# Patient Record
Sex: Male | Born: 1970 | Race: White | Hispanic: No | Marital: Married | Smoking: Never smoker
Health system: Southern US, Community
[De-identification: ages and names within clinical notes are randomized; demographics above are authoritative.]

## PROBLEM LIST (undated history)

## (undated) DIAGNOSIS — I10 Essential (primary) hypertension: Secondary | ICD-10-CM

## (undated) DIAGNOSIS — R51 Headache: Secondary | ICD-10-CM

## (undated) DIAGNOSIS — R519 Headache, unspecified: Secondary | ICD-10-CM

## (undated) DIAGNOSIS — M5136 Other intervertebral disc degeneration, lumbar region: Secondary | ICD-10-CM

## (undated) DIAGNOSIS — K469 Unspecified abdominal hernia without obstruction or gangrene: Secondary | ICD-10-CM

## (undated) DIAGNOSIS — Z972 Presence of dental prosthetic device (complete) (partial): Secondary | ICD-10-CM

## (undated) HISTORY — PX: KNEE SURGERY: SHX244

## (undated) HISTORY — PX: LUMBAR DISC SURGERY: SHX700

## (undated) HISTORY — PX: BACK SURGERY: SHX140

## (undated) HISTORY — DX: Other intervertebral disc degeneration, lumbar region: M51.36

## (undated) HISTORY — DX: Essential (primary) hypertension: I10

## (undated) HISTORY — DX: Unspecified abdominal hernia without obstruction or gangrene: K46.9

---

## 1999-04-22 ENCOUNTER — Ambulatory Visit (HOSPITAL_COMMUNITY): Admission: RE | Admit: 1999-04-22 | Discharge: 1999-04-22 | Payer: Self-pay | Admitting: Orthopedic Surgery

## 1999-04-22 ENCOUNTER — Encounter: Payer: Self-pay | Admitting: Orthopedic Surgery

## 2000-11-16 ENCOUNTER — Ambulatory Visit (HOSPITAL_COMMUNITY): Admission: RE | Admit: 2000-11-16 | Discharge: 2000-11-16 | Payer: Self-pay | Admitting: Neurosurgery

## 2000-11-16 ENCOUNTER — Encounter: Payer: Self-pay | Admitting: Neurosurgery

## 2000-12-14 ENCOUNTER — Encounter: Payer: Self-pay | Admitting: Neurosurgery

## 2000-12-14 ENCOUNTER — Encounter: Admission: RE | Admit: 2000-12-14 | Discharge: 2000-12-14 | Payer: Self-pay | Admitting: Neurosurgery

## 2001-01-15 ENCOUNTER — Encounter: Payer: Self-pay | Admitting: Neurosurgery

## 2001-01-15 ENCOUNTER — Encounter: Admission: RE | Admit: 2001-01-15 | Discharge: 2001-01-15 | Payer: Self-pay | Admitting: Neurosurgery

## 2001-04-14 ENCOUNTER — Encounter: Payer: Self-pay | Admitting: Neurosurgery

## 2001-04-16 ENCOUNTER — Inpatient Hospital Stay (HOSPITAL_COMMUNITY): Admission: RE | Admit: 2001-04-16 | Discharge: 2001-04-19 | Payer: Self-pay | Admitting: Neurosurgery

## 2001-04-16 ENCOUNTER — Encounter: Payer: Self-pay | Admitting: Neurosurgery

## 2001-04-18 ENCOUNTER — Encounter: Payer: Self-pay | Admitting: Neurosurgery

## 2002-11-02 ENCOUNTER — Encounter: Admission: RE | Admit: 2002-11-02 | Discharge: 2002-11-02 | Payer: Self-pay | Admitting: Neurosurgery

## 2002-11-02 ENCOUNTER — Encounter: Payer: Self-pay | Admitting: Neurosurgery

## 2002-11-02 ENCOUNTER — Encounter: Payer: Self-pay | Admitting: Radiology

## 2002-11-25 ENCOUNTER — Encounter: Payer: Self-pay | Admitting: Neurosurgery

## 2002-11-25 ENCOUNTER — Encounter: Admission: RE | Admit: 2002-11-25 | Discharge: 2002-11-25 | Payer: Self-pay | Admitting: Neurosurgery

## 2002-12-13 ENCOUNTER — Encounter: Admission: RE | Admit: 2002-12-13 | Discharge: 2002-12-13 | Payer: Self-pay | Admitting: Neurosurgery

## 2004-04-29 ENCOUNTER — Encounter: Admission: RE | Admit: 2004-04-29 | Discharge: 2004-04-29 | Payer: Self-pay | Admitting: Neurosurgery

## 2005-07-01 ENCOUNTER — Encounter: Admission: RE | Admit: 2005-07-01 | Discharge: 2005-07-01 | Payer: Self-pay | Admitting: Neurosurgery

## 2005-09-11 ENCOUNTER — Inpatient Hospital Stay (HOSPITAL_COMMUNITY): Admission: EM | Admit: 2005-09-11 | Discharge: 2005-09-13 | Payer: Self-pay | Admitting: Emergency Medicine

## 2007-01-30 ENCOUNTER — Emergency Department: Payer: Self-pay | Admitting: Emergency Medicine

## 2007-09-04 ENCOUNTER — Emergency Department (HOSPITAL_COMMUNITY): Admission: EM | Admit: 2007-09-04 | Discharge: 2007-09-04 | Payer: Self-pay | Admitting: Emergency Medicine

## 2007-09-07 ENCOUNTER — Encounter: Admission: RE | Admit: 2007-09-07 | Discharge: 2007-09-07 | Payer: Self-pay | Admitting: Neurosurgery

## 2007-09-14 ENCOUNTER — Encounter: Admission: RE | Admit: 2007-09-14 | Discharge: 2007-09-14 | Payer: Self-pay | Admitting: Neurosurgery

## 2007-09-27 ENCOUNTER — Encounter: Admission: RE | Admit: 2007-09-27 | Discharge: 2007-09-27 | Payer: Self-pay | Admitting: Neurosurgery

## 2007-10-22 ENCOUNTER — Ambulatory Visit (HOSPITAL_COMMUNITY): Admission: RE | Admit: 2007-10-22 | Discharge: 2007-10-24 | Payer: Self-pay | Admitting: Neurosurgery

## 2009-03-24 ENCOUNTER — Encounter: Admission: RE | Admit: 2009-03-24 | Discharge: 2009-03-24 | Payer: Self-pay | Admitting: Neurosurgery

## 2009-04-10 ENCOUNTER — Encounter: Admission: RE | Admit: 2009-04-10 | Discharge: 2009-04-10 | Payer: Self-pay | Admitting: Neurosurgery

## 2010-02-25 ENCOUNTER — Encounter: Payer: Self-pay | Admitting: Neurosurgery

## 2010-06-18 NOTE — H&P (Signed)
[name], [name]           ACCOUNT NO.:  [id number]   MEDICAL RECORD NO.:  [id number]          PATIENT TYPE:  OIB   LOCATION:  3009                         FACILITY:  [facility]   PHYSICIAN:  Payton Doughty, M.D.      DATE OF BIRTH:  [birth year]   DATE OF ADMISSION:  10/22/2007  DATE OF DISCHARGE:                              HISTORY & PHYSICAL   ADMITTING DIAGNOSES:  1. Recurrent disc at L4-5.  2. Herniated disc at L5-S1.   BODY OF TEXT:  This is a 40 year old right-handed white gentleman who  had a diskectomy done at L4-5 several years ago, did well.  He had a  fall and has had increasing pain in his back and left leg.  He has  undergone MRI that shows recurrent disc at L4-L5 and a new disc at L5-  S1.  He has failed epidural steroids and he is now admitted for  diskectomy.   MEDICAL HISTORY:  Benign.  He uses Vicodin.   SURGICAL HISTORY:  Two right knee operations as well his lumbar  diskectomy.   SOCIAL HISTORY:  Does not smoke and does not drink.  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   FAMILY HISTORY:  Father is 23 in good health.  Mother's history is not  given.   REVIEW OF SYSTEMS:  Remarkable for his back and leg pain.   PHYSICAL EXAMINATION:  HEENT:  Within normal limits.  NECK:  He has good range of motion.  CHEST:  Clear.  CARDIAC:  Regular rate and rhythm.  ABDOMEN:  Nontender.  No hepatosplenomegaly.  EXTREMITIES:  Without clubbing or cyanosis.  Peripheral pulses are good.  GU:  Deferred.  NEUROLOGIC:  He is awake, alert, and oriented.  Cranial nerves are  intact.  Motor exam shows 5/5 strength throughout the upper and lower  extremities.  Dorsiflexors on the left is about 4/5.  Sensory  dysesthesias described in the left L5-S1 distribution.  Reflexes are 2  at the knees, 1 at the ankle, toes downgoing bilaterally.  Straight leg  raise is positive on the left.   LABORATORY DATA:  MRI results has been reviewed above showed recurrent  disc at  L4-L5 and new herniated disc on the left side.   PLAN:  Laminectomy and diskectomy.  The risks and benefits have been  discussed with him, and he wishes to proceed.           ______________________________  Payton Doughty, M.D.     MWR/MEDQ  D:  10/22/2007  T:  10/23/2007  Job:  [id number]

## 2010-06-18 NOTE — Op Note (Signed)
NAMEZACKARI, RUANE           ACCOUNT NO.:  [id number]   MEDICAL RECORD NO.:  [id number]          PATIENT TYPE:  OIB   LOCATION:  3009                         FACILITY:  [facility]   PHYSICIAN:  Payton Doughty, M.D.      DATE OF BIRTH:  [birth year]   DATE OF PROCEDURE:  10/22/2007  DATE OF DISCHARGE:                               OPERATIVE REPORT   PREOPERATIVE DIAGNOSES:  Recurrent herniated disk on the left side at L4-  L5 and herniated disk on the left side at L5-S1.   POSTOPERATIVE DIAGNOSES:  Recurrent herniated disk on the left side at  L4-L5 and herniated disk on the left side at L5-S1.   OPERATIVE PROCEDURES:  Left L4-L5 and L5-S1 laminectomy and diskectomy.   SURGEON:  Payton Doughty, MD, Neurosurgery.   ANESTHESIA:  General endotracheal.   PREPARATION:  Prep and scrub with alcohol wipe.   COMPLICATIONS:  None.   NURSE ASSISTANT:  [place].   BODY OF THE TEXT:  This is a 40 year old with recurrent herniated disk  at L4-L5 and new herniated disk at L5-S1 on the left side.  He was taken  to the operating room, smoothly anesthetized and intubated, placed prone  on the operating table.  Following shave, prep, and drape in usual  sterile fashion, skin was incised from the top of L4 to the bottom of  L5, and the lamina of L4 and L5 were exposed on the left side in the  subperiosteal plane.  Intraoperative x-ray confirmed correctness of the  level.  Starting at L4-L5, the laminectomy was expanded and the scar  tissue taken down.  The epidural fat that had previously been placed was  identified, and dissection through it revealed the left L4-L5 nerve root  that was dissected free and retracted medially.  Underneath it was found  a small disk recurrence, which was removed without difficulty.  The disk  space was explored and all graspable fragments removed.  The nerve root  was carefully explored and found to be free in all quadrants.  Attention  was then turned to the L5-S1  interspace, where the hemisemilaminectomy  was carried out with the drill and Kerrison.  Ligamentum flavum removed  in retrograde fashion.  The left S1 nerve root was identified, dissected  free, and retracted medially exposing a bulging piece of annulus that  was excised resulting in immediate decompression of the nerve root.  The  disk space itself was explored and found to be free of graspable  fragments.  The nerve root was explored and found to be free in  all quadrants.  Depo-Medrol-soaked fat was placed in both laminotomy  defects.  Successive layers of 0 Vicryl, 2-0 Vicryl, and 3-0 nylon were  used to close.  Betadine and Telfa dressing was applied, made occlusive  with OpSite, and the patient returned to the recovery room in good  condition.           ______________________________  Payton Doughty, M.D.     MWR/MEDQ  D:  10/22/2007  T:  10/23/2007  Job:  [id number]

## 2010-06-21 NOTE — Discharge Summary (Signed)
[place]. [facility]  Patient:    [name], [name] Visit Number: [id number] MRN: [id number]          Service Type: SUR Location: 3000 3006 [id number] Attending Physician:  Emeterio Reeve Dictated by:   Payton Doughty, M.D. Admit Date:  04/16/2001 Discharge Date: 04/19/2001                             Discharge Summary  ADMITTING DIAGNOSIS:  Herniated disk, C6-7.  DISCHARGE DIAGNOSIS:  Herniated disk, C6-7.  PROCEDURE:  C6-7 anterior cervical diskectomy and fusion with Tether plate.  COMPLICATIONS:  None.  DISCHARGE STATUS:  Alive and well.  HISTORY OF PRESENT ILLNESS:  This is a 40 year old left-handed white gentleman whose history and physical is recounted on the chart.  He hurt his neck several years ago, had neck pain, pain out the left shoulder.  MR showed a herniated disk at 6-7, and he is admitted for fusion.  PAST MEDICAL HISTORY:  Benign.  PHYSICAL EXAMINATION:  GENERAL:  General exam is unremarkable.  NEUROLOGIC:  There is a mild left C7 radiculopathy.  HOSPITAL COURSE:  He was admitted after ascertaining normal laboratory values and underwent anterior cervical diskectomy and fusion.  Postoperatively he has done well.  He had some numbness, for which an x-ray was obtained, but that demonstrated no evidence of any difficulty with his fusion.  He is awake and alert, eating and voiding normally.  He is being discharged home in the care of his family.  FOLLOW-UP:  His follow-up will be in the [facility] office in two weeks for a lateral C-spine x-ray. Dictated by:   Payton Doughty, M.D. Attending Physician:  Emeterio Reeve DD:  04/19/01 TD:  04/20/01 Job: [id number]

## 2010-06-21 NOTE — Op Note (Signed)
[place]. [facility]  Patient:    [name], [name] Visit Number: [id number] MRN: [id number]          Service Type: SUR Location: 3000 3006 01 Attending Physician:  Emeterio Reeve Dictated by:   Payton Doughty, M.D. Proc. Date: 04/16/01 Admit Date:  04/16/2001                             Operative Report  PREOPERATIVE DIAGNOSIS:  Herniated disk C6-7.  POSTOPERATIVE DIAGNOSIS:  Herniated disk C6-7.  OPERATIVE PROCEDURE:  C6-7 anterior cervical diskectomy with tethered plate.  SURGEON:  Payton Doughty, M.D.  ANESTHESIA:  General endotracheal.  COMPLICATIONS:  None.  ASSISTANT:  Dr. Basilia Jumbo.  DESCRIPTION OF PROCEDURE:  This is a 40 year old right-handed white gentleman with a left C7 radiculopathy secondary to herniated disk C6-7.  He was taken to the operating room and placed supine on the operating table.  Following shave, he was prepped and draped in the usual sterile fashion.  The skin was incised in the midline.  The medial border of the sternocleidomastoid muscle. The platysma was identified, elevated, divided, and undermined.  The sternocleidomastoid was identified and medial dissection revealed the carotid artery tracking laterally to the left, trachea and esophagus tracking laterally to the right exposing the bones of the anterior cervical spine. Intraoperative x-rays obtained with marker in place and confirmed correctness of level.  Having confirmed correctness of level, dissection was carried out at C6-7 under gross observation.  The disk space spreader was placed and the operating microscope brought in and microdissection technique used to remove the remainder of the disk dissecting the anterior epidural space dividing the posterior longitudinal ligament and explore the neuroforamen bilaterally. Having completed diskectomy, there was a large disk found on the left side and protruding into the left C7 neuroforamen and it was removed  without difficulty.  Having completed decompression, a 7 mm bone graft was fashioned from prior allograft and tapped into place.  A 16 mm tethered plate was then placed with 13 mm screws, two in C6, and two in C7.  Intraoperative x-ray showed good placement of bone graft, plate, and screws.  The wound was irrigated and hemostasis insured.  The platysma was reapproximated with 3-0 Vicryl in interrupted fashion.  The subcutaneous tissue was reapproximated with 3-0 Vicryl interrupted fashion.  The skin was closed with 4-0 Vicryl in a running subcuticular fashion.  Benzoin and Steri-Strips were placed over an occlusive Telfa Op-Site dressing.  The patient was returned to the recovery room in good condition. Dictated by:   Payton Doughty, M.D. Attending Physician:  Emeterio Reeve DD:  04/16/01 TD:  04/17/01 Job: [id number]

## 2010-06-21 NOTE — Op Note (Signed)
NAMESACHIT, GILMAN           ACCOUNT NO.:  [id number]   MEDICAL RECORD NO.:  [id number]          PATIENT TYPE:  INP   LOCATION:  3019                         FACILITY:  [facility]   PHYSICIAN:  Payton Doughty, M.D.      DATE OF BIRTH:  Oct 02, 1970   DATE OF PROCEDURE:  09/12/2005  DATE OF DISCHARGE:                                 OPERATIVE REPORT   SURGEON:  Payton Doughty, M.D.   NURSE ASSISTANT:  [place].   SERVICE:  Neurosurgery.   PREOPERATIVE DIAGNOSIS:  Herniated disk on the left side at L4-5.   POSTOPERATIVE DIAGNOSIS:  Herniated disk on the left side at L4-5.   OPERATIVE PROCEDURE:  L4-5 laminectomy, diskectomy, done on the left.   ANESTHESIA:  General endotracheal.   PREPARATION:  Betadine scrub with an alcohol wipe.   COMPLICATIONS:  None.   INDICATIONS FOR PROCEDURE:  A 40 year old gentleman with a herniated disk on  the left side at L4-5.   DESCRIPTION OF PROCEDURE:  Taken to the operating room, anesthetized,  intubated, placed prone on the operating table.  Following shave, prep and  drape in the usual sterile fashion, the skin was infiltrated with 1%  lidocaine with 1:400,000 epinephrine, and the skin was incised over the  lamina of L4.  The lamina of L4 was exposed in a subperiosteal plane.  Intraoperative x-ray confirmed correctness of the level.  A hemi-  semilaminectomy was carried out at L4.  The top of the ligamentum flavum was  removed in a retrograde fashion.  The lateral margin of the thecal sac and  the nerve root were identified and gently dissected and retracted medially,  exposing a large herniated disk contained in the subligamentous space.  The  ligament was divided and the disk fragment removed easily.  The disk space  was explored and all graspable fragments removed.  The wound was irrigated  and hemostasis assured.  Depo-Medrol-soaked packs were used to fill the  laminectomy defect.  The incision was closed in successive layers of 2-0  Vicryl,  3-0 Vicryl and 4-0 Vicryl.  Benzoin and Steri-Strips were placed,  and the dressing completed with Telfa and OpSite.  And the patient returned  to the recovery room in good condition.           ______________________________  Payton Doughty, M.D.     MWR/MEDQ  D:  09/12/2005  T:  09/12/2005  Job:  [id number]
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
## 2010-07-16 ENCOUNTER — Encounter (INDEPENDENT_AMBULATORY_CARE_PROVIDER_SITE_OTHER): Payer: BC Managed Care – PPO | Admitting: Vascular Surgery

## 2010-07-16 DIAGNOSIS — M79609 Pain in unspecified limb: Secondary | ICD-10-CM

## 2010-07-16 NOTE — Consult Note (Signed)
NEW PATIENT CONSULTATION  KUE, FOX DOB:  09-Jun-1970                                       07/16/2010 ZOXWR#:[id number]  The patient presents today for evaluation of lower extremity discomfort. He is a 40 year old gentleman with a long history of orthopedic and low back difficulty.  He has undergone prior disk surgery 2007 and 2009. Both of these were secondary to what sounds like disk herniation with lower extremity weakness on each occasion.  He is having some recurrent back pain and is being seen by Dr. Trey Sailors for continued followup.  He does report some pain with walking and reports that he has "cold feet" at night.  PAST MEDICAL HISTORY:  Significant for borderline hypertension.  He has had prior knee surgeries in 1997 and 2000, cervical surgery in 2003.  SOCIAL HISTORY:  He is married with 2 children.  He drives a Tourist information centre manager. He does not smoke or drink alcohol.  FAMILY HISTORY:  Negative for premature atherosclerotic disease.  REVIEW OF SYSTEMS:  No weight loss or gain.  He weighs 198 pounds.  He is 5 feet 10 inches tall.  He does have pain in his legs with walking. NEUROLOGICAL:  Positive for headaches. MUSCULOSKELETAL:  Positive for arthritis, joint pain, muscle pain.  PHYSICAL EXAMINATION:  General:  A well-developed, well-nourished white male appearing stated age in no acute distress.  Vital signs:  Blood pressure is 143/102, heart rate 79, respirations 16.  HEENT:  Normal. He has 2+ radial, 2+ femoral and 2+ dorsalis pedis pulses bilaterally. He does not have any evidence of venous varicosities and no evidence of swelling.  Musculoskeletal:  Shows no major deformities or cyanosis. Neurological:  No focal weakness or paresthesias.  I did image his veins with handheld SonoSite for screening and this shows normal size saphenous vein and there is no evidence of varicosities.  I discussed the significance of all this with the patient.  I do  not see any evidence of arterial or venous pathology to account for any of his symptoms.  He will continue his followup with Dr. Channing Mutters regarding his degenerative disk disease and will see [place] on an as- needed basis.    Larina Earthly, M.D. Electronically Signed  TFE/MEDQ  D:  07/16/2010  T:  07/16/2010  Job:  [id number]  cc:   Payton Doughty, M.D.

## 2010-08-02 ENCOUNTER — Ambulatory Visit: Payer: Self-pay | Admitting: Neurosurgery

## 2010-10-22 ENCOUNTER — Ambulatory Visit: Payer: Self-pay | Admitting: Neurosurgery

## 2010-11-04 LAB — URINALYSIS, ROUTINE W REFLEX MICROSCOPIC
Hgb urine dipstick: NEGATIVE
Protein, ur: NEGATIVE
Specific Gravity, Urine: 1.035 — ABNORMAL HIGH
Urobilinogen, UA: 1

## 2010-11-04 LAB — DIFFERENTIAL
Basophils Absolute: 0
Basophils Relative: 1
Eosinophils Absolute: 0.1
Eosinophils Relative: 2
Lymphocytes Relative: 18
Lymphs Abs: 1.3
Monocytes Absolute: 0.6
Monocytes Relative: 8
Neutro Abs: 5.3
Neutrophils Relative %: 72

## 2010-11-04 LAB — COMPREHENSIVE METABOLIC PANEL
ALT: 95 — ABNORMAL HIGH
AST: 42 — ABNORMAL HIGH
Albumin: 4.3
Alkaline Phosphatase: 80
BUN: 10
CO2: 30
Calcium: 9.6
Chloride: 104
Creatinine, Ser: 0.97
GFR calc Af Amer: >60
GFR calc non Af Amer: >60
Glucose, Bld: 76
Potassium: 4.4
Sodium: 141
Total Bilirubin: 0.9
Total Protein: 7.1

## 2010-11-04 LAB — CBC
HCT: 46.5
Hemoglobin: 15.8
MCHC: 34.1
MCV: 95
Platelets: 266
RBC: 4.9
RDW: 13.2
WBC: 7.4

## 2010-11-04 LAB — URINE MICROSCOPIC-ADD ON

## 2010-11-04 LAB — PROTIME-INR
INR: 0.8
Prothrombin Time: 11.4 — ABNORMAL LOW

## 2010-11-04 LAB — APTT: aPTT: 28

## 2012-02-04 HISTORY — PX: HERNIA REPAIR: SHX51

## 2012-07-31 ENCOUNTER — Emergency Department: Payer: Self-pay | Admitting: Emergency Medicine

## 2012-08-03 DIAGNOSIS — K469 Unspecified abdominal hernia without obstruction or gangrene: Secondary | ICD-10-CM

## 2012-08-03 HISTORY — DX: Unspecified abdominal hernia without obstruction or gangrene: K46.9

## 2012-08-26 ENCOUNTER — Encounter: Payer: Self-pay | Admitting: *Deleted

## 2012-08-31 ENCOUNTER — Ambulatory Visit (INDEPENDENT_AMBULATORY_CARE_PROVIDER_SITE_OTHER): Payer: BC Managed Care – PPO | Admitting: General Surgery

## 2012-08-31 ENCOUNTER — Encounter: Payer: Self-pay | Admitting: General Surgery

## 2012-08-31 VITALS — BP 160/82 | HR 76 | Resp 14 | Ht 69.0 in | Wt 220.0 lb

## 2012-08-31 DIAGNOSIS — K429 Umbilical hernia without obstruction or gangrene: Secondary | ICD-10-CM

## 2012-08-31 NOTE — Progress Notes (Signed)
Patient ID: [name], male   DOB: 12-25-1970, 42 y.o.   MRN: [id number]  Chief Complaint  Patient presents with  . Other    evaluate for hernia    HPI [name] is a 42 y.o. male.  Patient here today for evaluation of umbilical hernia.  He first noticed this about 1 month ago. Patient reports this has gotten bigger and is painful. Patient also reports some bilateral lower leg swelling since his back surgery 2012. HPI  Past Medical History  Diagnosis Date  . Hernia July 2014    umbilical    Past Surgical History  Procedure Laterality Date  . Back surgery  2003, 2007, 2009, 2012    Dr Trey Sailors  . Knee surgery Right 1997, 2001    torn miniscus    Family History  Problem Relation Age of Onset  . Heart Problems Father     Social History History  Substance Use Topics  . Smoking status: Never Smoker   . Smokeless tobacco: Never Used  . Alcohol Use: No    No Known Allergies  Current Outpatient Prescriptions  Medication Sig Dispense Refill  . cyclobenzaprine (FLEXERIL) 10 MG tablet 2 (two) times daily as needed.       Marland Kitchen lisinopril (PRINIVIL,ZESTRIL) 20 MG tablet Take 20 mg by mouth daily.       Marland Kitchen oxyCODONE-acetaminophen (PERCOCET/ROXICET) 5-325 MG per tablet Take 1 tablet by mouth every 4 (four) hours as needed for pain.      . mometasone (ELOCON) 0.1 % cream        No current facility-administered medications for this visit.    Review of Systems Review of Systems  Constitutional: Negative.   Respiratory: Negative.   Cardiovascular: Positive for leg swelling. Negative for chest pain and palpitations.    Blood pressure 160/82, pulse 76, resp. rate 14, height 5\' 9"  (1.753 m), weight 220 lb (99.791 kg).  Physical Exam Physical Exam  Constitutional: He is oriented to person, place, and time. He appears well-developed and well-nourished.  Neck: Neck supple.  Cardiovascular: Normal rate and regular rhythm.   Pulmonary/Chest: Effort normal and  breath sounds normal.  Abdominal: Soft. Bowel sounds are normal. A hernia (umbilical area. small reducible, mildly tender) is present.  Lymphadenopathy:    He has no cervical adenopathy.  Neurological: He is alert and oriented to person, place, and time.    Data Reviewed    Assessment    Umbilical hernia. Mildly symptomatic     Plan    Discussed repair vs observation. Hernia complications, surgical repair all discussed. Await patient's decision     Patient to call the office when ready to arrange surgery.   SANKAR,SEEPLAPUTHUR G 08/31/2012, 6:52 PM

## 2012-08-31 NOTE — Patient Instructions (Addendum)
Hernia, Surgical Repair A hernia occurs when an internal organ pushes out through a weak spot in the belly (abdominal) wall muscles. Hernias commonly occur in the groin and around the navel. Hernias often can be pushed back into place (reduced). Most hernias tend to get worse over time. Problems occur when abdominal contents get stuck in the opening (incarcerated hernia). The blood supply gets cut off (strangulated hernia). This is an emergency and needs surgery. Otherwise, hernia repair can be an elective procedure. This means you can schedule this at your convenience when an emergency is not present. Because complications can occur, if you decide to repair the hernia, it is best to do it soon. When it becomes an emergency procedure, there is increased risk of complications after surgery. CAUSES   Heavy lifting.  Obesity.  Prolonged coughing.  Straining to move your bowels.  Hernias can also occur through a cut (incision) by a surgeonafter an abdominal operation. HOME CARE INSTRUCTIONS Before the repair:  Bed rest is not required. You may continue your normal activities, but avoid heavy lifting (more than 10 pounds) or straining. Cough gently. If you are a smoker, it is best to stop. Even the best hernia repair can break down with the continual strain of coughing.  Do not wear anything tight over your hernia. Do not try to keep it in with an outside bandage or truss. These can damage abdominal contents if they are trapped in the hernia sac.  Eat a normal diet. Avoid constipation. Straining over long periods of time to have a bowel movement will increase hernia size. It also can breakdown repairs. If you cannot do this with diet alone, laxatives or stool softeners may be used. PRIOR TO SURGERY, SEEK IMMEDIATE MEDICAL CARE IF: You have problems (symptoms) of a trapped (incarcerated) hernia. Symptoms include:  An oral temperature above 102 F (38.9 C) develops, or as your caregiver  suggests.  Increasing abdominal pain.  Feeling sick to your stomach(nausea) and vomiting.  You stop passing gas or stool.  The hernia is stuck outside the abdomen, looks discolored, feels hard, or is tender.  You have any changes in your bowel habits or in the hernia that is unusual for you. LET YOUR CAREGIVERS KNOW ABOUT THE FOLLOWING:  Allergies.  Medications taken including herbs, eye drops, over the counter medications, and creams.  Use of steroids (by mouth or creams).  Family or personal history of problems with anesthetics or Novocaine.  Possibility of pregnancy, if this applies.  Personal history of blood clots (thrombophlebitis).  Family or personal history of bleeding or blood problems.  Previous surgery.  Other health problems. BEFORE THE PROCEDURE You should be present 1 hour prior to your procedure, or as directed by your caregiver.  AFTER THE PROCEDURE After surgery, you will be taken to the recovery area. A nurse will watch and check your progress there. Once you are awake, stable, and taking fluids well, you will be allowed to go home as long as there are no problems. Once home, an ice pack (wrapped in a light towel) applied to your operative site may help with discomfort. It may also keep the swelling down. Do not lift anything heavier than 10 pounds (4.55 kilograms). Take showers not baths. Do not drive while taking narcotics. Follow instructions as suggested by your caregiver.  SEEK IMMEDIATE MEDICAL CARE IF: After surgery:  There is redness, swelling, or increasing pain in the wound.  There is pus coming from the wound.  There is  drainage from a wound lasting longer than 1 day.  An unexplained oral temperature above 102 F (38.9 C) develops.  You notice a foul smell coming from the wound or dressing.  There is a breaking open of a wound (edged not staying together) after the sutures have been removed.  You notice increasing pain in the shoulders  (shoulder strap areas).  You develop dizzy episodes or fainting while standing.  You develop persistent nausea or vomiting.  You develop a rash.  You have difficulty breathing.  You develop any reaction or side effects to medications given. MAKE SURE YOU:   Understand these instructions.  Will watch your condition.  Will get help right away if you are not doing well or get worse. Document Released: 07/16/2000 Document Revised: 04/14/2011 Document Reviewed: 06/08/2007 [facility] Patient Information 2014 [place], [place].  Patient to call the office when ready to arrange surgery.

## 2012-09-23 ENCOUNTER — Other Ambulatory Visit: Payer: Self-pay | Admitting: General Surgery

## 2012-09-23 ENCOUNTER — Telehealth: Payer: Self-pay | Admitting: *Deleted

## 2012-09-23 DIAGNOSIS — K429 Umbilical hernia without obstruction or gangrene: Secondary | ICD-10-CM

## 2012-09-23 NOTE — Telephone Encounter (Signed)
Patient called wanting to schedule umbilical hernia repair. This patient's surgery has been scheduled for 10-01-12 at [facility]. He reports no change in medications. Paperwork has been faxed to the patient per his request.

## 2012-09-29 ENCOUNTER — Ambulatory Visit: Payer: Self-pay | Admitting: Anesthesiology

## 2012-09-29 DIAGNOSIS — I10 Essential (primary) hypertension: Secondary | ICD-10-CM

## 2012-10-01 ENCOUNTER — Ambulatory Visit: Payer: Self-pay | Admitting: General Surgery

## 2012-10-01 DIAGNOSIS — K429 Umbilical hernia without obstruction or gangrene: Secondary | ICD-10-CM

## 2012-10-05 ENCOUNTER — Telehealth: Payer: Self-pay

## 2012-10-05 NOTE — Telephone Encounter (Signed)
Patient called to schedule Post Op hernia repair appointment. He also requested a return to work slip for light duty for today. 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 He states that he is doing very well post operatively.

## 2012-10-06 ENCOUNTER — Encounter: Payer: Self-pay | Admitting: General Surgery

## 2012-10-12 ENCOUNTER — Ambulatory Visit (INDEPENDENT_AMBULATORY_CARE_PROVIDER_SITE_OTHER): Payer: BC Managed Care – PPO | Admitting: General Surgery

## 2012-10-12 ENCOUNTER — Encounter: Payer: Self-pay | Admitting: General Surgery

## 2012-10-12 VITALS — BP 144/87 | HR 88 | Resp 12 | Ht 70.0 in | Wt 215.0 lb

## 2012-10-12 DIAGNOSIS — K429 Umbilical hernia without obstruction or gangrene: Secondary | ICD-10-CM

## 2012-10-12 NOTE — Progress Notes (Signed)
This is an 42 year old male here today for his post op umbilical hernia repair done on 10/01/12. He had a small fatty hernia with a 5mm defect in fascia. Patient states he is doing well.   No signs of infection,incision  looks clean and healing well. Abd is soft,non tender.

## 2012-10-12 NOTE — Patient Instructions (Addendum)
Patient to return as needed. 

## 2012-12-22 ENCOUNTER — Other Ambulatory Visit: Payer: Self-pay | Admitting: Neurosurgery

## 2012-12-22 DIAGNOSIS — M47816 Spondylosis without myelopathy or radiculopathy, lumbar region: Secondary | ICD-10-CM

## 2012-12-28 ENCOUNTER — Other Ambulatory Visit: Payer: Self-pay | Admitting: Neurosurgery

## 2012-12-28 ENCOUNTER — Ambulatory Visit
Admission: RE | Admit: 2012-12-28 | Discharge: 2012-12-28 | Disposition: A | Discharge status: Home / Self Care | Payer: No Typology Code available for payment source | Source: Ambulatory Visit | Attending: Neurosurgery | Admitting: Neurosurgery

## 2012-12-28 ENCOUNTER — Ambulatory Visit
Admission: RE | Admit: 2012-12-28 | Discharge: 2012-12-28 | Disposition: A | Discharge status: Home / Self Care | Payer: BC Managed Care – PPO | Source: Ambulatory Visit | Attending: Neurosurgery | Admitting: Neurosurgery

## 2012-12-28 DIAGNOSIS — M47816 Spondylosis without myelopathy or radiculopathy, lumbar region: Secondary | ICD-10-CM

## 2012-12-29 ENCOUNTER — Other Ambulatory Visit: Payer: BC Managed Care – PPO

## 2012-12-31 ENCOUNTER — Other Ambulatory Visit: Payer: BC Managed Care – PPO

## 2012-12-31 ENCOUNTER — Ambulatory Visit
Admission: RE | Admit: 2012-12-31 | Discharge: 2012-12-31 | Disposition: A | Discharge status: Home / Self Care | Payer: BC Managed Care – PPO | Source: Ambulatory Visit | Attending: Neurosurgery | Admitting: Neurosurgery

## 2012-12-31 DIAGNOSIS — M47816 Spondylosis without myelopathy or radiculopathy, lumbar region: Secondary | ICD-10-CM

## 2013-05-31 ENCOUNTER — Ambulatory Visit (INDEPENDENT_AMBULATORY_CARE_PROVIDER_SITE_OTHER): Payer: No Typology Code available for payment source | Admitting: General Surgery

## 2013-05-31 ENCOUNTER — Encounter: Payer: Self-pay | Admitting: General Surgery

## 2013-05-31 VITALS — BP 150/84 | HR 80 | Resp 12 | Ht 70.0 in | Wt 221.0 lb

## 2013-05-31 DIAGNOSIS — K429 Umbilical hernia without obstruction or gangrene: Secondary | ICD-10-CM

## 2013-05-31 DIAGNOSIS — R109 Unspecified abdominal pain: Secondary | ICD-10-CM

## 2013-05-31 NOTE — Patient Instructions (Addendum)
Follow up appointment to be announced.  Patient is scheduled for CT abdomen pelvis with contrast at [facility] location on 06/03/13 at 9:30 am. He will arrive by 9:15 am. He will have Creatinine level drawn that morning. He is to pick up a prep kit 2 days prior to his exam. He will have no solid foods 4 hours prior to the exam. patient is aware of date, time, and all instructions.

## 2013-05-31 NOTE — Progress Notes (Signed)
Patient ID: [name], male   DOB: [birth year], 43 y.o.   MRN: [id number]  Chief Complaint  Patient presents with  . Other    umbilical hernia    HPI [name] is a 43 y.o. male here today for a evaluation of a umbilical hernia. Patient states he noticed about two weeks ago. Patient states burning pain on superior of the umbilical area. Pt saw his PCP 2 weeks ago because of the pain. He reports the pain is worse with activity. He denies taking OTC pain medicine for relief.  History of umbilical hernia repair  in 2014. Repair of a subcentimeter defect at that time  HPI  Past Medical History  Diagnosis Date  . Hernia July 1275    umbilical  . Hypertension     Past Surgical History  Procedure Laterality Date  . Back surgery  2003, 2007, 2009, 2012    Dr Glenna Fellows  . Knee surgery Right 1997, 2001    torn miniscus  . Hernia repair  1700    umbilical hernia    Family History  Problem Relation Age of Onset  . Heart Problems Father     Social History History  Substance Use Topics  . Smoking status: Never Smoker   . Smokeless tobacco: Never Used  . Alcohol Use: No    Allergies  Allergen Reactions  . Latex Rash    Current Outpatient Prescriptions  Medication Sig Dispense Refill  . cyclobenzaprine (FLEXERIL) 10 MG tablet 2 (two) times daily as needed.       Marland Kitchen HYDROcodone-acetaminophen (NORCO/VICODIN) 5-325 MG per tablet       . lisinopril (PRINIVIL,ZESTRIL) 20 MG tablet Take 20 mg by mouth daily.       . mometasone (ELOCON) 0.1 % cream       . oxyCODONE-acetaminophen (PERCOCET/ROXICET) 5-325 MG per tablet Take 1 tablet by mouth every 4 (four) hours as needed for pain.       No current facility-administered medications for this visit.    Review of Systems Review of Systems  Constitutional: Negative.   Respiratory: Negative.   Cardiovascular: Negative.     Blood pressure 150/84, pulse 80, resp. rate 12, height 5' 10"  (1.778 m), weight 221 lb  (100.245 kg).  Physical Exam Physical Exam  Constitutional: He is oriented to person, place, and time. He appears well-developed and well-nourished.  Eyes: Conjunctivae are normal.  Neck: Neck supple.  Cardiovascular: Normal rate and normal heart sounds.   Pulmonary/Chest: Effort normal and breath sounds normal.  Abdominal: Soft. Normal appearance and bowel sounds are normal. There is tenderness ( mild tenderness at superioer aspect of umbilicus, 1-2 cm size ). A hernia is present.  There is tenderness to the left of the umbilicus  midabdomen.  Neurological: He is alert and oriented to person, place, and time.  Skin: Skin is warm and dry.    Data Reviewed None   Assessment    Small umbilical hernia does not fully acount for left midabdomen pain.     Plan    Patient to have a cat scan. Discussed umbilical hernia repair.     Patient is scheduled for CT abdomen pelvis with contrast at [facility] location on 06/03/13 at 9:30 am. He will arrive by 9:15 am. He will have Creatinine level drawn that morning. He is to pick up a prep kit 2 days prior to his exam. He will have no solid foods 4 hours prior to the exam. patient is  aware of date, time, and all instructions.  Jahyra Sukup G [place] 06/01/2013, 6:04 AM

## 2013-06-01 ENCOUNTER — Encounter: Payer: Self-pay | Admitting: General Surgery

## 2013-06-03 ENCOUNTER — Ambulatory Visit: Payer: Self-pay | Admitting: General Surgery

## 2013-06-03 ENCOUNTER — Encounter: Payer: Self-pay | Admitting: General Surgery

## 2013-06-06 ENCOUNTER — Ambulatory Visit: Payer: BC Managed Care – PPO | Admitting: General Surgery

## 2013-06-06 ENCOUNTER — Telehealth: Payer: Self-pay

## 2013-06-06 NOTE — Telephone Encounter (Signed)
Patient has been scheduled for repair [facility] umbilical her[facility] on 06/21/13. He will pre admit by phone on 06/13/13. He is aware of date and instructions. Surgery instructions have been mailed to him.

## 2013-06-08 ENCOUNTER — Other Ambulatory Visit: Payer: Self-pay | Admitting: General Surgery

## 2013-06-08 DIAGNOSIS — K429 Umbilical hernia without obstruction or gangrene: Secondary | ICD-10-CM

## 2013-06-21 ENCOUNTER — Ambulatory Visit: Payer: Self-pay | Admitting: General Surgery

## 2013-06-21 DIAGNOSIS — K429 Umbilical hernia without obstruction or gangrene: Secondary | ICD-10-CM

## 2013-06-22 ENCOUNTER — Encounter: Payer: Self-pay | Admitting: General Surgery

## 2013-06-23 ENCOUNTER — Telehealth: Payer: Self-pay | Admitting: *Deleted

## 2013-06-23 NOTE — Telephone Encounter (Signed)
Pt had umbilical hernia on 06/14/13 and was just wondering when he was able to return to work

## 2013-06-30 ENCOUNTER — Ambulatory Visit: Payer: No Typology Code available for payment source | Admitting: General Surgery

## 2013-07-04 ENCOUNTER — Encounter: Payer: Self-pay | Admitting: General Surgery

## 2013-07-04 ENCOUNTER — Ambulatory Visit (INDEPENDENT_AMBULATORY_CARE_PROVIDER_SITE_OTHER): Payer: No Typology Code available for payment source | Admitting: General Surgery

## 2013-07-04 VITALS — BP 152/84 | HR 74 | Resp 12 | Ht 70.0 in | Wt 218.0 lb

## 2013-07-04 DIAGNOSIS — K429 Umbilical hernia without obstruction or gangrene: Secondary | ICD-10-CM

## 2013-07-04 NOTE — Patient Instructions (Signed)
Patient to return in 1 month for follow up. The patient is aware to call back for any questions or concerns.  

## 2013-07-04 NOTE — Progress Notes (Signed)
Patient ID: [name], male   DOB: 12/22/1970, 43 y.o.   MRN: [id number]  The patient presents for a post op umbilical hernia repair. Procedure done laparoscopic, and 8cm Ventralex patch used. The procedure was performed on 06/21/13. No complaints at this time.   Hernia repair intact. Clean healing port sites. Abdomen is soft, non tender.  May advance activity slowly as tolerated. F/U 1 mo.

## 2013-08-03 ENCOUNTER — Ambulatory Visit (INDEPENDENT_AMBULATORY_CARE_PROVIDER_SITE_OTHER): Payer: No Typology Code available for payment source | Admitting: General Surgery

## 2013-08-03 ENCOUNTER — Encounter: Payer: Self-pay | Admitting: General Surgery

## 2013-08-03 VITALS — BP 146/80 | HR 76 | Resp 14 | Ht 70.0 in | Wt 218.0 lb

## 2013-08-03 DIAGNOSIS — K420 Umbilical hernia with obstruction, without gangrene: Secondary | ICD-10-CM

## 2013-08-03 NOTE — Patient Instructions (Signed)
Patient to return as needed. 

## 2013-08-03 NOTE — Progress Notes (Signed)
The patient presents for a post op umbilical hernia repair. Procedure done laparoscopic, and 8cm Ventralex patch used. The procedure was performed on 06/21/13. Redness noticed about three days ago  5 mm red area at end of umbilical incision with small residual vicryl stitch removed.   Hernia repair intact. Abdomen is soft, non tender.  Advised to use antibiotic ointment for few days F/u prn.

## 2013-08-05 ENCOUNTER — Encounter: Payer: Self-pay | Admitting: General Surgery

## 2013-11-08 ENCOUNTER — Other Ambulatory Visit: Payer: Self-pay | Admitting: Neurosurgery

## 2013-11-08 DIAGNOSIS — M4302 Spondylolysis, cervical region: Secondary | ICD-10-CM

## 2013-11-15 ENCOUNTER — Ambulatory Visit
Admission: RE | Admit: 2013-11-15 | Discharge: 2013-11-15 | Disposition: A | Discharge status: Home / Self Care | Payer: No Typology Code available for payment source | Source: Ambulatory Visit | Attending: Neurosurgery | Admitting: Neurosurgery

## 2013-11-15 DIAGNOSIS — M4302 Spondylolysis, cervical region: Secondary | ICD-10-CM

## 2014-03-29 ENCOUNTER — Observation Stay: Payer: Self-pay | Admitting: Internal Medicine

## 2014-04-21 ENCOUNTER — Other Ambulatory Visit: Payer: Self-pay | Admitting: Neurosurgery

## 2014-04-21 DIAGNOSIS — M5020 Other cervical disc displacement, unspecified cervical region: Secondary | ICD-10-CM

## 2014-04-27 ENCOUNTER — Ambulatory Visit
Admission: RE | Admit: 2014-04-27 | Discharge: 2014-04-27 | Disposition: A | Discharge status: Home / Self Care | Payer: No Typology Code available for payment source | Source: Ambulatory Visit | Attending: Neurosurgery | Admitting: Neurosurgery

## 2014-04-27 DIAGNOSIS — M5020 Other cervical disc displacement, unspecified cervical region: Secondary | ICD-10-CM

## 2014-04-27 MED ORDER — IOHEXOL 300 MG/ML  SOLN
1.0000 mL | Freq: Once | INTRAMUSCULAR | Status: AC | PRN
  Administered 2014-04-27: 1 mL via EPIDURAL

## 2014-04-27 MED ORDER — TRIAMCINOLONE ACETONIDE 40 MG/ML IJ SUSP (RADIOLOGY)
60.0000 mg | Freq: Once | INTRAMUSCULAR | Status: AC
  Administered 2014-04-27: 60 mg via EPIDURAL

## 2014-04-27 NOTE — Discharge Instructions (Signed)

## 2014-05-26 NOTE — Op Note (Signed)
PATIENT NAME:  [name], [name] MR#:  [id number] DATE OF BIRTH:  10-02-1970  DATE OF PROCEDURE:  10/01/2012  PREOPERATIVE DIAGNOSIS: Umbilical hernia.  POSTOPERATIVE DIAGNOSIS: Umbilical hernia.  OPERATION: Repair of umbilical hernia.   SURGEON: Kathreen CosierS. G. Miriana Gaertner, M.D.   ANESTHESIA: General.   COMPLICATIONS: None.   ESTIMATED BLOOD LOSS: Minimal.   DRAINS: None.   DESCRIPTION OF PROCEDURE: The patient was put to sleep with a LMA. Her abdomen was prepped and draped out as a sterile field. Timeout was performed and the umbilical area was palpated. There was a tiny defect that was identified with a 1 to 1.5 cm protrusion from the umbilicus. A transverse incision was made overlying the inferior lip  of the umbilicus. The skin was elevated to reveal a tiny hernia pushing through a defect of about 5 mm. This was freed and excised out with cautery. The fascial opening was then closed with a single figure-of-eight stitch of 0 Prolene. The subcutaneous tissue was approximated with 3-0 Vicryl, and the skin closed with subcuticular 4-0 Vicryl, covered with Dermabond. The procedure was well tolerated. He was subsequently returned to the recovery room in stable condition.  ____________________________ S.Wynona LunaG. Alyaan Budzynski, MD sgs:sb D: 10/01/2012 16:25:55 ET T: 10/01/2012 16:47:24 ET JOB#: [id number]  cc: Timoteo ExposeS.G. Evette CristalSankar, MD, <Dictator> [facility] Wynona LunaG Dontrey Snellgrove MD ELECTRONICALLY SIGNED 10/03/2012 10:29

## 2014-05-27 NOTE — Op Note (Signed)
PATIENT NAME:  [name], [name] MR#:  [id number] DATE OF BIRTH:  1970/08/20  DATE OF PROCEDURE:  06/21/2013  PREOPERATIVE DIAGNOSIS: Recurrent umbilical hernia.   POSTOPERATIVE DIAGNOSIS: Recurrent umbilical hernia.    OPERATION: Laparoscopy and repair of recurrent umbilical hernia.   SURGEON: S.G. Evette CristalSankar, MD   ANESTHESIA: General.   COMPLICATIONS: None.   ESTIMATED BLOOD LOSS: Minimal.   DESCRIPTION OF PROCEDURE: The patient was put to sleep in the supine position on the operating table, and the abdomen was prepped and draped out as a sterile field. A timeout was performed. Initial entry was made in the left upper quadrant through a small port site incision, a Veress needle was positioned in the peritoneal cavity and verified with the hanging drop method. Pneumoperitoneum was obtained, and an 11 mm port was placed. The camera was introduced, with good visualization of the peritoneal cavity. Two additional 5 mm ports were placed along the left lateral abdomen. The umbilical hernia was inspected. It was a fingerbreadth opening with plenty of fatty tissue surrounding this, some of which had incarcerated into the hernia. With careful exposure and retraction of the fatty tissue surrounding the umbilical defect, it was dissected off using the Thunderbeat device and excised out completely, including the portions of the fat that were herniated into the defect. All of this was placed in a retrieval bag and brought out from the peritoneal cavity. Following this, a small incision was made overlying the umbilical defect area. A Ventralex SD hernia patch 8 cm diameter was then positioned in the peritoneal cavity, and the leaves were then pulled up through the fascial defect in position against the abdominal wall. SecureStrap was used to tack the edges around the mesh, and after this was done, pneumoperitoneum was released. The fascial opening was then approximated, incorporating the anterior leaves with a  single 2-0 Prolene stitch, and the excess of the anterior leaves was removed. The fascial opening of the left upper quadrant site was closed with 3-0 Vicryl stitch, and after removing the ports, the skin incisions were all closed with subcuticular 4-0 Vicryl, covered with Dermabond. The procedure was well tolerated. He was subsequently extubated and returned to the recovery room in stable condition.   ____________________________ S.Wynona LunaG. Manav Pierotti, MD sgs:lb D: 06/22/2013 08:08:46 ET T: 06/22/2013 08:17:59 ET JOB#: [id number]  cc: Timoteo ExposeS.G. Evette CristalSankar, MD, <Dictator> [facility] Wynona LunaG Shriya Aker MD ELECTRONICALLY SIGNED 06/23/2013 14:44

## 2014-06-02 ENCOUNTER — Other Ambulatory Visit: Payer: Self-pay | Admitting: Neurosurgery

## 2014-06-02 DIAGNOSIS — M47816 Spondylosis without myelopathy or radiculopathy, lumbar region: Secondary | ICD-10-CM

## 2014-06-04 NOTE — Discharge Summary (Signed)
PATIENT NAME:  Charles SauersMCPHERSON, [name] MR#:  [id number] DATE OF BIRTH:  1970-03-20  DATE OF ADMISSION:  03/29/2014 DATE OF DISCHARGE:  03/30/2014  PRIMARY CARE PHYSICIAN:  Steele SizerMark A. Crissman, MD   FINAL DIAGNOSES: 1.  Chest pain, likely musculoskeletal in nature.  2.  Hypertension, essential.  3.  Chronic neck and back pain.  MEDICATIONS ON DISCHARGE: Include cyclobenzaprine 10 mg 3 times a day as needed, acetaminophen and hydrocodone 325/5 one tablet every 8 hours as needed for pain, hydrochlorothiazide and lisinopril 12.5/20 mg 1 tablet daily, prednisone 5 mg 4 tablets a  day 1, 3 tablets day 2, 2 tablets day 3, and 4, and 1 tablet day 5 and 6, then stop.   DIET: Low sodium diet, regular consistency.   ACTIVITY: As tolerated.  FOLLOWUP: With Dr. Trey SailorsMark Roy, neurosurgery, as outpatient; Dr. Adrian BlackwaterShaukat Khan, Monday at 10:00 a.m., in 1-2 weeks with Dr. Dossie Arbourrissman.   HOSPITAL COURSE: The patient was admitted 03/29/2014, and discharged 03/30/2014. Came in with chest pain, also had some left arm numbness, and tight feeling in his back.   LABORATORY AND RADIOLOGICAL DATA: Included an EKG that showed sinus tachycardia, nonspecific ST-T wave changes. D-dimer negative. Glucose 125, BUN 17, creatinine 1.06, sodium 137, potassium 3.7, chloride 103, CO2 of 27, calcium 8.9. White blood cell count 7.5, H and H 16.6 and 50.8, platelet count of 273,000. Troponin negative.   Chest x-ray, low lung volumes. Next couple troponins were negative. TSH 3.02, LDL 149, HDL 34, triglycerides 176.  Hemoglobin A1c of 5.9. Stress test negative.     HOSPITAL COURSE PER PROBLEM LIST:  1.  For the patient's chest pain, likely musculoskeletal in nature. The patient has a history of back and neck issues. He feels a tightness in between his shoulder blades. I will give him a prednisone taper to see if that helps; anything inflammatory this should help, and follow up with Dr. Dossie Arbourrissman as outpatient. If it does not help, may consider an  MRI of the thoracic spine and followup with neurosurgery.  2.  Essential hypertension. Blood pressure accelerated when having pain when he came in. Blood pressure is stable today, continue same medication.  3.  Chronic neck and back pain. Follow up as outpatient. The patient has Percocet and Flexeril.  TIME SPENT ON DISCHARGE: 35 minutes.    ____________________________ Herschell Dimesichard J. Renae GlossWieting, MD rjw:LT D: 03/30/2014 14:18:45 ET T: 03/31/2014 10:54:08 ET JOB#: [id number]  cc: Herschell Dimesichard J. Renae GlossWieting, MD, <Dictator> Steele SizerMark A. Crissman, MD Trey SailorsMark Roy, MD Laurier NancyShaukat A. Khan, MD   Salley ScarletICHARD J Cristoval Teall MD ELECTRONICALLY SIGNED 04/12/2014 10:45

## 2014-06-04 NOTE — H&P (Signed)
PATIENT NAME:  [name], [name] DATE OF BIRTH:  Jan 30, 1971  DATE OF ADMISSION:  03/29/2014  PRIMARY CARE PHYSICIAN: Steele SizerMark A. Crissman, MD   REFERRING PHYSICIAN: Raelyn EnsignMark R Quale, MD   CHIEF COMPLAINT: Chest pain today.   HISTORY OF PRESENT ILLNESS: A 44 year old Caucasian male with a history of hypertension, who presented to the ED with the above chief complaint. The patient is alert, awake, oriented, in no acute distress. The patient started to have chest pain today, which was on the left side, sharp, about 6-7/10. The patient is not sure if the chest pain has radiation or not, due to the patient has chronic back pain. In addition, the patient denies any diaphoresis, nausea or vomiting, denies any other symptoms.   The patient was planned to be discharged home; however, when the patient started walking the patient had chest pain again, so Dr. Fanny BienQuale discussed with Dr. Welton FlakesKhan, we will place the patient for observation to get a stress test.   PAST MEDICAL HISTORY: Hypertension, also the patient has psoriasis.  PAST SURGICAL HISTORY: Four times back surgery and also right knee surgery, neck surgery, umbilical hernia repair twice.   SOCIAL HISTORY: No smoking, alcohol drinking or illicit drugs.   FAMILY HISTORY: Father died of heart attack at 7669, grandfather had a heart attack at 5970. Brother has diabetes.   ALLERGIES: CELEBREX, NEOPRENE AND TALC, LATEX, TAPE.   HOME MEDICATIONS:  1.  Hydrochlorothiazide/lisinopril 12.5 mg/20 mg p.o. tablets once a day.  2.  Flexeril 10 mg p.o. t.i.d. p.r.n.  3.  Percocet 325 mg/5 mg p.o. 1 to 2 tablets every 8 hours p.r.n.   REVIEW OF SYSTEMS: CONSTITUTIONAL: The patient denies any fever or chills. No headache. No dizziness or weakness.  EYES: No double vision or blurred vision.  EARS AND NOSE AND THROAT: No postnasal drip, slurred speech or dysphagia.  CARDIOVASCULAR: Possible chest pain. No palpitations, orthopnea, or nocturnal dyspnea. No  leg edema.  PULMONARY: No cough, sputum, shortness of breath, or hemoptysis.  GASTROINTESTINAL: No abdominal pain, nausea, vomiting, diarrhea. No melena or bloody stool.  GENITOURINARY: No dysuria, hematuria or incontinence.  SKIN: No rash or jaundice.  NEUROLOGIC: No syncope, loss of consciousness or seizure.  ENDOCRINOLOGY: No polyuria, polydipsia, heat or cold intolerance.  SKIN: No rash or jaundice.  HEMATOLOGIC: No easy bleeding or bleeding.   PHYSICAL EXAMINATION:  VITAL SIGNS: Temperature 98.6, blood pressure 116/75, pulse 80, oxygen saturation 97% in room air.  GENERAL: The patient is alert, awake, oriented, in no acute distress.  HEENT: Pupils round, equal, reactive to light and accommodation. Moist oral mucosa. Clear oropharynx.  NECK: Supple. No JVD or carotid bruit. No lymphadenopathy. No thyromegaly.  CARDIOVASCULAR: S1 and S2. Regular rate, rhythm. No murmurs or gallops.  PULMONARY: Bilateral air entry. No wheezing or rales. No use of accessory muscle to breathe. ABDOMEN: Soft. No distention or tenderness. No organomegaly. Bowel sounds present.  EXTREMITIES: No edema, clubbing or cyanosis. No calf tenderness. Bilateral pedal pulses present.  SKIN: No rash or jaundice.  NEUROLOGY: A and O x 3. No focal deficit. Power 5/5. Sensation intact.   LABORATORY DATA: Glucose 125, BUN 17, creatinine 1.06. Electrolytes normal. Troponin less than 0.02 for 2 times. CBC in normal range. D-dimer 296 Chest x-ray: Low lung volume, no acute cardiopulmonary abnormality.   IMPRESSIONS:  1.  Chest pain, no acute coronary syndrome, but need to rule out coronary artery disease.  2.  Hypertension.  3.  Obesity.  PLAN OF TREATMENT:  1.  The patient will be placed for observation.  2.  We will continue aspirin.  3.   Check a lipid panel and get a stress test tomorrow.  4.  For hypertension, continue hydrochlorothiazide/lisinopril.   I discussed the patient's condition and plan of treatment  with the patient and the patient's wife.   TIME SPENT: About 42 minutes.    ____________________________ Shaune Pollack, MD qc:TM D: 03/29/2014 17:13:40 ET T: 03/29/2014 17:44:48 ET JOB#: [id number]  cc: Shaune Pollack, MD, <Dictator> Shaune Pollack MD ELECTRONICALLY SIGNED 04/01/2014 13:31

## 2014-06-04 NOTE — Consult Note (Signed)
PATIENT NAME:  [name], [name] MR#:  [id number] DATE OF BIRTH:  [birth year]  DATE OF CONSULTATION:  03/30/2014  INDICATION FOR CONSULTATION: Chest pain.   HISTORY OF PRESENT ILLNESS: This is a 44 year old white male with a past medical history of premature coronary artery disease, who presented to the Emergency Room with chest pain. The chest pain was pressure-type associated with shortness of breath and diaphoresis. He was seen by Dr. Fanny BienQuale in the Emergency Room and was admitted to the hospital. He denies any chest pain since then.   PAST MEDICAL HISTORY: History of hypertension. No history of diabetes, hyperlipidemia.   SOCIAL HISTORY: No history of EtOH abuse or smoking.   FAMILY HISTORY: Father and grandfather had myocardial infarction and died from it.   PHYSICAL EXAMINATION:  GENERAL: He is alert, oriented x3, in no acute distress.  VITAL SIGNS: Stable.  HEENT: No JVD.  LUNGS: Clear.  HEART: Regular rate and rhythm. Normal S1, S2. No audible murmur.  ABDOMEN: Soft, nontender, positive bowel sounds.  EXTREMITIES: No pedal edema.   DIAGNOSTIC DATA: EKG shows normal sinus rhythm. No acute changes. Cardiac enzymes were negative.   ASSESSMENT AND PLAN: Atypical chest pain. Having stress Myoview today. EKG portion of the test was unremarkable. We will read the nuclear scan and have a follow up Monday at 10:00 in the office.   ____________________________ Laurier NancyShaukat A. Jolisa Intriago, MD sak:ap D: 03/30/2014 10:02:36 ET T: 03/30/2014 10:48:55 ET JOB#: [id number]  cc: Laurier NancyShaukat A. Taela Charbonneau, MD, <Dictator> Laurier NancySHAUKAT A Mabrey Howland MD ELECTRONICALLY SIGNED 04/06/2014 12:18

## 2014-06-09 ENCOUNTER — Ambulatory Visit
Admission: RE | Admit: 2014-06-09 | Discharge: 2014-06-09 | Disposition: A | Discharge status: Home / Self Care | Payer: No Typology Code available for payment source | Source: Ambulatory Visit | Attending: Neurosurgery | Admitting: Neurosurgery

## 2014-06-09 DIAGNOSIS — M47816 Spondylosis without myelopathy or radiculopathy, lumbar region: Secondary | ICD-10-CM

## 2014-06-09 MED ORDER — METHYLPREDNISOLONE ACETATE 40 MG/ML INJ SUSP (RADIOLOG
120.0000 mg | Freq: Once | INTRAMUSCULAR | Status: AC
  Administered 2014-06-09: 120 mg via EPIDURAL

## 2014-06-09 MED ORDER — IOHEXOL 180 MG/ML  SOLN
1.0000 mL | Freq: Once | INTRAMUSCULAR | Status: AC | PRN
  Administered 2014-06-09: 1 mL via EPIDURAL

## 2014-07-20 DIAGNOSIS — I10 Essential (primary) hypertension: Secondary | ICD-10-CM | POA: Insufficient documentation

## 2014-07-20 DIAGNOSIS — M5030 Other cervical disc degeneration, unspecified cervical region: Secondary | ICD-10-CM | POA: Insufficient documentation

## 2014-07-20 DIAGNOSIS — E785 Hyperlipidemia, unspecified: Secondary | ICD-10-CM | POA: Insufficient documentation

## 2014-07-20 DIAGNOSIS — M5136 Other intervertebral disc degeneration, lumbar region: Secondary | ICD-10-CM

## 2014-07-25 ENCOUNTER — Ambulatory Visit (INDEPENDENT_AMBULATORY_CARE_PROVIDER_SITE_OTHER): Payer: No Typology Code available for payment source | Admitting: Unknown Physician Specialty

## 2014-07-25 ENCOUNTER — Encounter: Payer: Self-pay | Admitting: Unknown Physician Specialty

## 2014-07-25 VITALS — BP 123/83 | HR 93 | Temp 98.3°F | Ht 69.6 in | Wt 216.6 lb

## 2014-07-25 DIAGNOSIS — Z0000 Encounter for general adult medical examination without abnormal findings: Secondary | ICD-10-CM

## 2014-07-25 NOTE — Progress Notes (Signed)
DOT PE.  See form.  Recheck in 1 year

## 2014-07-26 LAB — UA/M W/RFLX CULTURE, ROUTINE
BILIRUBIN UA: NEGATIVE
GLUCOSE, UA: NEGATIVE
KETONES UA: NEGATIVE
Leukocytes, UA: NEGATIVE
Nitrite, UA: NEGATIVE
PROTEIN UA: NEGATIVE
RBC UA: NEGATIVE
SPEC GRAV UA: 1.025 (ref 1.005–1.030)
UUROB: 1 mg/dL (ref 0.2–1.0)
pH, UA: 7 (ref 5.0–7.5)

## 2015-06-22 ENCOUNTER — Encounter: Payer: Self-pay | Admitting: Unknown Physician Specialty

## 2015-07-06 ENCOUNTER — Encounter: Payer: Self-pay | Admitting: Family Medicine

## 2015-07-06 ENCOUNTER — Ambulatory Visit (INDEPENDENT_AMBULATORY_CARE_PROVIDER_SITE_OTHER): Payer: 59 | Admitting: Family Medicine

## 2015-07-06 VITALS — BP 136/86 | HR 80 | Temp 98.1°F | Wt 225.0 lb

## 2015-07-06 DIAGNOSIS — T485X1A Poisoning by other anti-common-cold drugs, accidental (unintentional), initial encounter: Secondary | ICD-10-CM | POA: Diagnosis not present

## 2015-07-06 DIAGNOSIS — J310 Chronic rhinitis: Secondary | ICD-10-CM | POA: Diagnosis not present

## 2015-07-06 DIAGNOSIS — T485X5A Adverse effect of other anti-common-cold drugs, initial encounter: Secondary | ICD-10-CM

## 2015-07-06 MED ORDER — TRIAMCINOLONE ACETONIDE 55 MCG/ACT NA AERO: 2.0000 | INHALATION_SPRAY | Freq: Every day | NASAL | Status: DC

## 2015-07-06 MED ORDER — PREDNISONE 10 MG PO TABS: ORAL_TABLET | ORAL | Status: DC

## 2015-07-06 MED ORDER — AMOXICILLIN-POT CLAVULANATE 875-125 MG PO TABS: 1.0000 | ORAL_TABLET | Freq: Two times a day (BID) | ORAL | Status: DC

## 2015-07-06 MED ORDER — ZOLPIDEM TARTRATE 10 MG PO TABS: 10.0000 mg | ORAL_TABLET | Freq: Every evening | ORAL | Status: DC | PRN

## 2015-07-06 NOTE — Progress Notes (Signed)
BP 136/86 mmHg  Pulse 80  Temp(Src) 98.1 F (36.7 C)  Wt 225 lb (102.059 kg)  SpO2 95%   Subjective:    Patient ID: [name], male    DOB: October 18, 1970, 45 y.o.   MRN: [id number]  HPI: [name] is a 45 y.o. male  Chief Complaint  Patient presents with  . URI    Patient states that he has had nasal congestion for months, has tried sveral different otc medications that has not helped him.    patient with chronic nasal congestion ongoing essentially all year. Patient's been using  Afrin constantly over the last week and intermittently pretty much all year.  Patient can breathe for a few hours after using  Afrin then came breathing.  has been bothered wit headache sinus pressure congestion. Relevant past medical, surgical, family and social history reviewed and updated as indicated. Interim medical history since our last visit reviewed. Allergies and medications reviewed and updated.  Review of Systems  Constitutional: Positive for fatigue. Negative for fever.  HENT: Positive for rhinorrhea, sinus pressure and sneezing. Negative for nosebleeds.   Respiratory: Negative.   Cardiovascular: Negative.     Per HPI unless specifically indicated above     Objective:    BP 136/86 mmHg  Pulse 80  Temp(Src) 98.1 F (36.7 C)  Wt 225 lb (102.059 kg)  SpO2 95%  Wt Readings from Last 3 Encounters:  07/06/15 225 lb (102.059 kg)  07/25/14 216 lb 9.6 oz (98.249 kg)  03/13/14 219 lb (99.338 kg)    Physical Exam  Constitutional: He is oriented to person, place, and time. He appears well-developed and well-nourished. No distress.  HENT:  Head: Normocephalic and atraumatic.  Right Ear: Hearing and external ear normal.  Left Ear: Hearing and external ear normal.  Nose: Nose normal.  Mouth/Throat: Oropharyngeal exudate present.  Eyes: Conjunctivae and lids are normal. Right eye exhibits no discharge. Left eye exhibits no discharge. No scleral icterus.  Neck: No  thyromegaly present.  Cardiovascular: Normal rate, regular rhythm and normal heart sounds.   Pulmonary/Chest: Effort normal and breath sounds normal. No respiratory distress.  Musculoskeletal: Normal range of motion.  Lymphadenopathy:    He has no cervical adenopathy.  Neurological: He is alert and oriented to person, place, and time.  Skin: Skin is intact. No rash noted.  Psychiatric: He has a normal mood and affect. His speech is normal and behavior is normal. Judgment and thought content normal. Cognition and memory are normal.    Results for orders placed or performed in visit on 07/25/14  UA/M w/rflx Culture, Routine  Result Value Ref Range   Specific Gravity, UA 1.025 1.005 - 1.030   pH, UA 7.0 5.0 - 7.5   Color, UA Yellow Yellow   Appearance Ur Clear Clear   Leukocytes, UA Negative Negative   Protein, UA Negative Negative/Trace   Glucose, UA Negative Negative   Ketones, UA Negative Negative   RBC, UA Negative Negative   Bilirubin, UA Negative Negative   Urobilinogen, Ur 1.0 0.2 - 1.0 mg/dL   Nitrite, UA Negative Negative      Assessment & Plan:   Problem List Items Addressed This Visit      Respiratory   Rhinitis medicamentosa - Primary    Gave patient written directions on care Augmentin, prednisone, Nasacort, for 3 days using Afrin sparingly. After 3 days discontinue Afrin completely gave patient prescription for 5 Ambien may use Ambien is not sleeping. If symptoms  persistent nasal congestion and head congestion persists will refer to ear nose throat Patient also to use neti pot during this period          Follow up plan: Return for As scheduled.

## 2015-07-06 NOTE — Assessment & Plan Note (Signed)
Gave patient written directions on care Augmentin, prednisone, Nasacort, for 3 days using Afrin sparingly. After 3 days discontinue Afrin completely gave patient prescription for 5 Ambien may use Ambien is not sleeping. If symptoms persistent nasal congestion and head congestion persists will refer to ear nose throat Patient also to use neti pot during this period

## 2015-07-18 ENCOUNTER — Encounter: Payer: Self-pay | Admitting: Unknown Physician Specialty

## 2015-07-18 ENCOUNTER — Ambulatory Visit (INDEPENDENT_AMBULATORY_CARE_PROVIDER_SITE_OTHER): Payer: Self-pay | Admitting: Unknown Physician Specialty

## 2015-07-18 VITALS — BP 119/82 | HR 96 | Temp 97.7°F | Ht 69.0 in | Wt 222.4 lb

## 2015-07-18 DIAGNOSIS — Z0000 Encounter for general adult medical examination without abnormal findings: Secondary | ICD-10-CM

## 2015-07-18 LAB — URINALYSIS, DIPSTICK ONLY
BILIRUBIN UA: NEGATIVE
GLUCOSE, UA: NEGATIVE
KETONES UA: NEGATIVE
Leukocytes, UA: NEGATIVE
Nitrite, UA: NEGATIVE
PROTEIN UA: NEGATIVE
RBC UA: NEGATIVE
SPEC GRAV UA: 1.02 (ref 1.005–1.030)
UUROB: 0.2 mg/dL (ref 0.2–1.0)
pH, UA: 5 (ref 5.0–7.5)

## 2015-07-18 NOTE — Progress Notes (Signed)
   BP 119/82 mmHg  Pulse 96  Temp(Src) 97.7 F (36.5 C)  Ht 5\' 9"  (1.753 m)  Wt 222 lb 6.4 oz (100.88 kg)  BMI 32.83 kg/m2  SpO2 96%   Subjective:    Patient ID: [name], male    DOB: [birth year], 45 y.o.   MRN: [id number]  HPI: Charles SauersMichael J Tozzi is a 45 y.o. male  Chief Complaint  Patient presents with  . DOT Physical    Relevant past medical, surgical, family and social history reviewed and updated as indicated. Interim medical history since our last visit reviewed. Allergies and medications reviewed and updated.  Review of Systems  Per HPI unless specifically indicated above     Objective:    BP 119/82 mmHg  Pulse 96  Temp(Src) 97.7 F (36.5 C)  Ht 5\' 9"  (1.753 m)  Wt 222 lb 6.4 oz (100.88 kg)  BMI 32.83 kg/m2  SpO2 96%  Wt Readings from Last 3 Encounters:  07/18/15 222 lb 6.4 oz (100.88 kg)  07/06/15 225 lb (102.059 kg)  07/25/14 216 lb 9.6 oz (98.249 kg)    Physical Exam  DOT.  See form Results for orders placed or performed in visit on 07/25/14  UA/M w/rflx Culture, Routine  Result Value Ref Range   Specific Gravity, UA 1.025 1.005 - 1.030   pH, UA 7.0 5.0 - 7.5   Color, UA Yellow Yellow   Appearance Ur Clear Clear   Leukocytes, UA Negative Negative   Protein, UA Negative Negative/Trace   Glucose, UA Negative Negative   Ketones, UA Negative Negative   RBC, UA Negative Negative   Bilirubin, UA Negative Negative   Urobilinogen, Ur 1.0 0.2 - 1.0 mg/dL   Nitrite, UA Negative Negative      Assessment & Plan:   Problem List Items Addressed This Visit    None    Visit Diagnoses    Routine general medical examination at a health care facility    -  Primary    Relevant Orders    Urinalysis, dipstick only       Qualified for 2 years Follow up plan: Return if symptoms worsen or fail to improve.

## 2015-09-06 ENCOUNTER — Telehealth: Payer: Self-pay | Admitting: Family Medicine

## 2015-09-06 DIAGNOSIS — T485X5A Adverse effect of other anti-common-cold drugs, initial encounter: Secondary | ICD-10-CM

## 2015-09-06 DIAGNOSIS — R0981 Nasal congestion: Secondary | ICD-10-CM

## 2015-09-06 DIAGNOSIS — J310 Chronic rhinitis: Secondary | ICD-10-CM

## 2015-09-06 NOTE — Telephone Encounter (Signed)
Pt called and stated that he would like to get a referral to go to ENT. Pt had office visit for this 07/06/15.

## 2015-10-18 ENCOUNTER — Encounter: Payer: Self-pay | Admitting: *Deleted

## 2015-10-23 NOTE — Discharge Instructions (Signed)
[facility] °[facility] °ENDOSCOPIC SINUS SURGERY °[facility] ° °What is Functional Endoscopic Sinus Surgery? ° The Surgery involves making the natural openings of the sinuses larger by removing the bony partitions that separate the sinuses from the nasal cavity.  The natural sinus lining is preserved as much as possible to allow the sinuses to resume normal function after the surgery.  In some patients nasal polyps (excessively swollen lining of the sinuses) may be removed to relieve obstruction of the sinus openings.  The surgery is performed through the nose using lighted scopes, which eliminates the need for incisions on the face.  A septoplasty is a different procedure which is sometimes performed with sinus surgery.  It involves straightening the boy partition that separates the two sides of your nose.  A crooked or deviated septum may need repair if is obstructing the sinuses or nasal airflow.  Turbinate reduction is also often performed during sinus surgery.  The turbinates are bony proturberances from the side walls of the nose which swell and can obstruct the nose in patients with sinus and allergy problems.  Their size can be surgically reduced to help relieve nasal obstruction. ° °What Can Sinus Surgery Do For Me? ° Sinus surgery can reduce the frequency of sinus infections requiring antibiotic treatment.  This can provide improvement in nasal congestion, post-nasal drainage, facial pressure and nasal obstruction.  Surgery will NOT prevent you from ever having an infection again, so it usually only for patients who get infections 4 or more times yearly requiring antibiotics, or for infections that do not clear with antibiotics.  It will not cure nasal allergies, so patients with allergies may still require medication to treat their allergies after surgery. Surgery may improve headaches related to sinusitis, however, some people will continue to  require medication to control sinus headaches related to allergies.  Surgery will do nothing for other forms of headache (migraine, tension or cluster). ° °What Are the Risks of Endoscopic Sinus Surgery? ° Current techniques allow surgery to be performed safely with little risk, however, there are rare complications that patients should be aware of.  Because the sinuses are located around the eyes, there is risk of eye injury, including blindness, though again, this would be quite rare. This is usually a result of bleeding behind the eye during surgery, which puts the vision oat risk, though there are treatments to protect the vision and prevent permanent disrupted by surgery causing a leak of the spinal fluid that surrounds the brain.  More serious complications would include bleeding inside the brain cavity or damage to the brain.  Again, all of these complications are uncommon, and spinal fluid leaks can be safely managed surgically if they occur.  The most common complication of sinus surgery is bleeding from the nose, which may require packing or cauterization of the nose.  Continued sinus have polyps may experience recurrence of the polyps requiring revision surgery.  Alterations of sense of smell or injury to the tear ducts are also rare complications.  ° °What is the Surgery Like, and what is the Recovery? ° The Surgery usually takes a couple of hours to perform, and is usually performed under a general anesthetic (completely asleep).  Patients are usually discharged home after a couple of hours.  Sometimes during surgery it is necessary to pack the nose to control bleeding, and the packing is left in place for 24 - 48 hours, and removed by your surgeon.    If a septoplasty was performed during the procedure, there is often a splint placed which must be removed after 5-7 days.   °Discomfort: Pain is usually mild to moderate, and can be controlled by prescription pain medication or acetaminophen (Tylenol).   Aspirin, Ibuprofen (Advil, Motrin), or Naprosyn (Aleve) should be avoided, as they can cause increased bleeding.  Most patients feel sinus pressure like they have a bad head cold for several days.  Sleeping with your head elevated can help reduce swelling and facial pressure, as can ice packs over the face.  A humidifier may be helpful to keep the mucous and blood from drying in the nose.  ° °Diet: There are no specific diet restrictions, however, you should generally start with clear liquids and a light diet of bland foods because the anesthetic can cause some nausea.  Advance your diet depending on how your stomach feels.  Taking your pain medication with food will often help reduce stomach upset which pain medications can cause. ° °Nasal Saline Irrigation: It is important to remove blood clots and dried mucous from the nose as it is healing.  This is done by having you irrigate the nose at least 3 - 4 times daily with a salt water solution.  We recommend using NeilMed Sinus Rinse (available at the drug store).  Fill the squeeze bottle with the solution, bend over a sink, and insert the tip of the squeeze bottle into the nose ½ of an inch.  Point the tip of the squeeze bottle towards the inside corner of the eye on the same side your irrigating.  Squeeze the bottle and gently irrigate the nose.  If you bend forward as you do this, most of the fluid will flow back out of the nose, instead of down your throat.   The solution should be warm, near body temperature, when you irrigate.   Each time you irrigate, you should use a full squeeze bottle.  ° °Note that if you are instructed to use Nasal Steroid Sprays at any time after your surgery, irrigate with saline BEFORE using the steroid spray, so you do not wash it all out of the nose. °Another product, Nasal Saline Gel (such as AYR Nasal Saline Gel) can be applied in each nostril 3 - 4 times daily to moisture the nose and reduce scabbing or crusting. ° °Bleeding:   Bloody drainage from the nose can be expected for several days, and patients are instructed to irrigate their nose frequently with salt water to help remove mucous and blood clots.  The drainage may be dark red or brown, though some fresh blood may be seen intermittently, especially after irrigation.  Do not blow you nose, as bleeding may occur. If you must sneeze, keep your mouth open to allow air to escape through your mouth. ° °If heavy bleeding occurs: Irrigate the nose with saline to rinse out clots, then spray the nose 3 - 4 times with Afrin Nasal Decongestant Spray.  The spray will constrict the blood vessels to slow bleeding.  Pinch the lower half of your nose shut to apply pressure, and lay down with your head elevated.  Ice packs over the nose may help as well. If bleeding persists despite these measures, you should notify your doctor.  Do not use the Afrin routinely to control nasal congestion after surgery, as it can result in worsening congestion and may affect healing.  ° ° ° °Activity: Return to work varies among patients. Most patients will be   out of work at least 5 - 7 days to recover.  Patient may return to work after they are off of narcotic pain medication, and feeling well enough to perform the functions of their job.  Patients must avoid heavy lifting (over 10 pounds) or strenuous physical for 2 weeks after surgery, so your employer may need to assign you to light duty, or keep you out of work longer if light duty is not possible.  NOTE: you should not drive, operate dangerous machinery, do any mentally demanding tasks or make any important legal or financial decisions while on narcotic pain medication and recovering from the general anesthetic.  °  °Call Your Doctor Immediately if You Have Any of the Following: °1. Bleeding that you cannot control with the above measures °2. Loss of vision, double vision, bulging of the eye or black eyes. °3. Fever over 101 degrees °4. Neck stiffness with  severe headache, fever, nausea and change in mental state. °You are always encourage to call anytime with concerns, however, please call with requests for pain medication refills during office hours. ° °Office Endoscopy: During follow-up visits your doctor will remove any packing or splints that may have been placed and evaluate and clean your sinuses endoscopically.  Topical anesthetic will be used to make this as comfortable as possible, though you may want to take your pain medication prior to the visit.  How often this will need to be done varies from patient to patient.  After complete recovery from the surgery, you may need follow-up endoscopy from time to time, particularly if there is concern of recurrent infection or nasal polyps. ° °General Anesthesia, Adult, Care After °Refer to this sheet in the next few weeks. These instructions provide you with information on caring for yourself after your procedure. Your health care provider may also give you more specific instructions. Your treatment has been planned according to current medical practices, but problems sometimes occur. Call your health care provider if you have any problems or questions after your procedure. °WHAT TO EXPECT AFTER THE PROCEDURE °After the procedure, it is typical to experience: °· Sleepiness. °· Nausea and vomiting. °HOME CARE INSTRUCTIONS °· For the first 24 hours after general anesthesia: °¨ Have a responsible person with you. °¨ Do not drive a car. If you are alone, do not take public transportation. °¨ Do not drink alcohol. °¨ Do not take medicine that has not been prescribed by your health care provider. °¨ Do not sign important papers or make important decisions. °¨ You may resume a normal diet and activities as directed by your health care provider. °· Change bandages (dressings) as directed. °· If you have questions or problems that seem related to general anesthesia, call the hospital and ask for the anesthetist or  anesthesiologist on call. °SEEK MEDICAL CARE IF: °· You have nausea and vomiting that continue the day after anesthesia. °· You develop a rash. °SEEK IMMEDIATE MEDICAL CARE IF:  °· You have difficulty breathing. °· You have chest pain. °· You have any allergic problems. °  °This information is not intended to replace advice given to you by your health care provider. Make sure you discuss any questions you have with your health care provider. °  °Document Released: 04/28/2000 Document Revised: 02/10/2014 Document Reviewed: 05/21/2011 °Elsevier Interactive Patient Education ©2016 [employer] ° °

## 2015-10-26 ENCOUNTER — Ambulatory Visit: Payer: 59 | Admitting: Student in an Organized Health Care Education/Training Program

## 2015-10-26 ENCOUNTER — Encounter
Admission: RE | Disposition: A | Discharge status: Home / Self Care | Payer: Self-pay | Source: Ambulatory Visit | Attending: Unknown Physician Specialty

## 2015-10-26 ENCOUNTER — Ambulatory Visit
Admission: RE | Admit: 2015-10-26 | Discharge: 2015-10-26 | Disposition: A | Discharge status: Home / Self Care | Payer: 59 | Source: Ambulatory Visit | Attending: Unknown Physician Specialty | Admitting: Unknown Physician Specialty

## 2015-10-26 DIAGNOSIS — G8929 Other chronic pain: Secondary | ICD-10-CM | POA: Diagnosis not present

## 2015-10-26 DIAGNOSIS — J3489 Other specified disorders of nose and nasal sinuses: Secondary | ICD-10-CM | POA: Diagnosis not present

## 2015-10-26 DIAGNOSIS — Z8261 Family history of arthritis: Secondary | ICD-10-CM | POA: Insufficient documentation

## 2015-10-26 DIAGNOSIS — Z833 Family history of diabetes mellitus: Secondary | ICD-10-CM | POA: Insufficient documentation

## 2015-10-26 DIAGNOSIS — Z8249 Family history of ischemic heart disease and other diseases of the circulatory system: Secondary | ICD-10-CM | POA: Insufficient documentation

## 2015-10-26 DIAGNOSIS — Z825 Family history of asthma and other chronic lower respiratory diseases: Secondary | ICD-10-CM | POA: Diagnosis not present

## 2015-10-26 DIAGNOSIS — Z823 Family history of stroke: Secondary | ICD-10-CM | POA: Diagnosis not present

## 2015-10-26 DIAGNOSIS — M549 Dorsalgia, unspecified: Secondary | ICD-10-CM | POA: Diagnosis not present

## 2015-10-26 DIAGNOSIS — I10 Essential (primary) hypertension: Secondary | ICD-10-CM | POA: Diagnosis not present

## 2015-10-26 DIAGNOSIS — J342 Deviated nasal septum: Secondary | ICD-10-CM | POA: Insufficient documentation

## 2015-10-26 DIAGNOSIS — Z809 Family history of malignant neoplasm, unspecified: Secondary | ICD-10-CM | POA: Insufficient documentation

## 2015-10-26 DIAGNOSIS — J343 Hypertrophy of nasal turbinates: Secondary | ICD-10-CM | POA: Insufficient documentation

## 2015-10-26 HISTORY — PX: NASAL TURBINATE REDUCTION: SHX2072

## 2015-10-26 HISTORY — DX: Presence of dental prosthetic device (complete) (partial): Z97.2

## 2015-10-26 HISTORY — DX: Headache: R51

## 2015-10-26 HISTORY — DX: Headache, unspecified: R51.9

## 2015-10-26 HISTORY — PX: SEPTOPLASTY: SHX2393

## 2015-10-26 SURGERY — SEPTOPLASTY, NOSE
Anesthesia: General | Laterality: Bilateral | Wound class: Clean Contaminated

## 2015-10-26 MED ORDER — OXYCODONE HCL 5 MG/5ML PO SOLN: 5.0000 mg | Freq: Once | ORAL | Status: DC | PRN

## 2015-10-26 MED ORDER — ACETAMINOPHEN 10 MG/ML IV SOLN
INTRAVENOUS | Status: DC | PRN
  Administered 2015-10-26: 1000 mg via INTRAVENOUS

## 2015-10-26 MED ORDER — LIDOCAINE HCL (CARDIAC) 20 MG/ML IV SOLN
INTRAVENOUS | Status: DC | PRN
  Administered 2015-10-26: 50 mg via INTRAVENOUS

## 2015-10-26 MED ORDER — LACTATED RINGERS IV SOLN
INTRAVENOUS | Status: DC | PRN
  Administered 2015-10-26: 10:00:00 via INTRAVENOUS

## 2015-10-26 MED ORDER — FENTANYL CITRATE (PF) 100 MCG/2ML IJ SOLN
INTRAMUSCULAR | Status: DC | PRN
  Administered 2015-10-26: 100 ug via INTRAVENOUS

## 2015-10-26 MED ORDER — PHENYLEPHRINE HCL 0.5 % NA SOLN
NASAL | Status: DC | PRN
  Administered 2015-10-26: 15 mL via TOPICAL

## 2015-10-26 MED ORDER — DEXAMETHASONE SODIUM PHOSPHATE 4 MG/ML IJ SOLN
INTRAMUSCULAR | Status: DC | PRN
  Administered 2015-10-26: 8 mg via INTRAVENOUS

## 2015-10-26 MED ORDER — LIDOCAINE-EPINEPHRINE 1 %-1:100000 IJ SOLN
INTRAMUSCULAR | Status: DC | PRN
  Administered 2015-10-26: 10 mL

## 2015-10-26 MED ORDER — OXYCODONE HCL 5 MG PO TABS: 5.0000 mg | ORAL_TABLET | Freq: Once | ORAL | Status: DC | PRN

## 2015-10-26 MED ORDER — SULFAMETHOXAZOLE-TRIMETHOPRIM 800-160 MG PO TABS: 1.0000 | ORAL_TABLET | Freq: Two times a day (BID) | ORAL | 0 refills | Status: DC

## 2015-10-26 MED ORDER — SUCCINYLCHOLINE CHLORIDE 20 MG/ML IJ SOLN
INTRAMUSCULAR | Status: DC | PRN
  Administered 2015-10-26: 50 mg via INTRAVENOUS

## 2015-10-26 MED ORDER — LACTATED RINGERS IV SOLN: INTRAVENOUS | Status: DC

## 2015-10-26 MED ORDER — MIDAZOLAM HCL 5 MG/5ML IJ SOLN
INTRAMUSCULAR | Status: DC | PRN
  Administered 2015-10-26: 2 mg via INTRAVENOUS

## 2015-10-26 MED ORDER — OXYCODONE-ACETAMINOPHEN 5-325 MG PO TABS: 1.0000 | ORAL_TABLET | ORAL | 0 refills | Status: DC | PRN

## 2015-10-26 MED ORDER — ONDANSETRON HCL 4 MG/2ML IJ SOLN
INTRAMUSCULAR | Status: DC | PRN
  Administered 2015-10-26: 4 mg via INTRAVENOUS

## 2015-10-26 MED ORDER — OXYMETAZOLINE HCL 0.05 % NA SOLN
6.0000 | Freq: Once | NASAL | Status: AC
  Administered 2015-10-26: 6 via NASAL

## 2015-10-26 MED ORDER — PROPOFOL 10 MG/ML IV BOLUS
INTRAVENOUS | Status: DC | PRN
  Administered 2015-10-26: 200 mg via INTRAVENOUS

## 2015-10-26 SURGICAL SUPPLY — 32 items
BLADE SURG 15 STRL LF DISP TIS (BLADE) IMPLANT
BLADE SURG 15 STRL SS (BLADE)
COAG SUCT 10F 3.5MM HAND CTRL (MISCELLANEOUS) ×2 IMPLANT
DRAPE HEAD BAR (DRAPES) ×2 IMPLANT
DRESSING NASL FOAM PST OP SINU (MISCELLANEOUS) IMPLANT
DRSG NASAL FOAM POST OP SINU (MISCELLANEOUS) ×4
GLOVE BIO SURGEON STRL SZ7.5 (GLOVE) ×4 IMPLANT
HANDLE YANKAUER SUCT BULB TIP (MISCELLANEOUS) ×2 IMPLANT
KIT ROOM TURNOVER OR (KITS) ×2 IMPLANT
NDL HYPO 25GX1X1/2 BEV (NEEDLE) ×1 IMPLANT
NEEDLE HYPO 25GX1X1/2 BEV (NEEDLE) ×2 IMPLANT
NS IRRIG 500ML POUR BTL (IV SOLUTION) ×2 IMPLANT
PACK DRAPE NASAL/ENT (PACKS) ×2 IMPLANT
PAD GROUND ADULT SPLIT (MISCELLANEOUS) ×2 IMPLANT
SOL ANTI-FOG 6CC FOG-OUT (MISCELLANEOUS) ×1 IMPLANT
SOL FOG-OUT ANTI-FOG 6CC (MISCELLANEOUS) ×1
SPLINT NASAL SEPTAL BLV .25 LG (MISCELLANEOUS) ×1 IMPLANT
SPLINT NASAL SEPTAL BLV .50 ST (MISCELLANEOUS) ×2 IMPLANT
SPONGE NEURO XRAY DETECT 1X3 (DISPOSABLE) ×2 IMPLANT
STRAP BODY AND KNEE 60X3 (MISCELLANEOUS) ×2 IMPLANT
SUT CHROMIC 3-0 (SUTURE) ×2
SUT CHROMIC 3-0 KS 27XMFL CR (SUTURE) ×1
SUT CHROMIC 5-0 (SUTURE)
SUT CHROMIC 5-0 P2 18XMFL CR (SUTURE)
SUT ETHILON 3-0 KS 30 BLK (SUTURE) ×2 IMPLANT
SUT PLAIN GUT 4-0 (SUTURE) IMPLANT
SUTURE CHRMC 3-0 KS 27XMFL CR (SUTURE) ×1 IMPLANT
SUTURE CHRMC 5-0 P2 18XMF CR (SUTURE) IMPLANT
SYRINGE 10CC LL (SYRINGE) ×2 IMPLANT
TOWEL OR 17X26 4PK STRL BLUE (TOWEL DISPOSABLE) ×2 IMPLANT
WATER STERILE IRR 250ML POUR (IV SOLUTION) ×2 IMPLANT
WATER STERILE IRR 500ML POUR (IV SOLUTION) ×2 IMPLANT

## 2015-10-26 NOTE — Transfer of Care (Signed)
Immediate Anesthesia Transfer of Care Note  Patient: Charles SauersMichael J Robleto  Procedure(s) Performed: Procedure(s): SEPTOPLASTY (Bilateral) TURBINATE REDUCTION/SUBMUCOSAL RESECTION (Bilateral)  Patient Location: PACU  Anesthesia Type: General ETT  Level of Consciousness: awake, alert  and patient cooperative  Airway and Oxygen Therapy: Patient Spontanous Breathing and Patient connected to supplemental oxygen  Post-op Assessment: Post-op Vital signs reviewed, Patient's Cardiovascular Status Stable, Respiratory Function Stable, Patent Airway and No signs of Nausea or vomiting  Post-op Vital Signs: Reviewed and stable  Complications: No apparent anesthesia complications

## 2015-10-26 NOTE — Anesthesia Preprocedure Evaluation (Signed)
Anesthesia Evaluation  Patient identified by MRN, date of birth, ID band  Reviewed: NPO status   History of Anesthesia Complications Negative for: history of anesthetic complications  Airway Mallampati: II  TM Distance: >3 FB Neck ROM: full    Dental no notable dental hx.    Pulmonary neg pulmonary ROS,    Pulmonary exam normal        Cardiovascular Exercise Tolerance: Good hypertension (no meds yet), Normal cardiovascular exam  Negative CP w/u 2015;   Neuro/Psych  Headaches, negative psych ROS   GI/Hepatic negative GI ROS, Neg liver ROS,   Endo/Other  negative endocrine ROS  Renal/GU negative Renal ROS  negative genitourinary   Musculoskeletal  (+) Arthritis , Chronic Back pain from DDD;   Abdominal   Peds  Hematology negative hematology ROS (+)   Anesthesia Other Findings   Reproductive/Obstetrics                             Anesthesia Physical Anesthesia Plan  ASA: II  Anesthesia Plan: General ETT   Post-op Pain Management:    Induction:   Airway Management Planned:   Additional Equipment:   Intra-op Plan:   Post-operative Plan:   Informed Consent: I have reviewed the patients History and Physical, chart, labs and discussed the procedure including the risks, benefits and alternatives for the proposed anesthesia with the patient or authorized representative who has indicated his/her understanding and acceptance.     Plan Discussed with: CRNA  Anesthesia Plan Comments:         Anesthesia Quick Evaluation

## 2015-10-26 NOTE — Anesthesia Procedure Notes (Signed)
Procedure Name: Intubation Date/Time: 10/26/2015 11:55 AM Performed by: Andee PolesBUSH, Zvi Duplantis Pre-anesthesia Checklist: Patient identified, Emergency Drugs available, Suction available, Patient being monitored and Timeout performed Patient Re-evaluated:Patient Re-evaluated prior to inductionOxygen Delivery Method: Circle system utilized Preoxygenation: Pre-oxygenation with 100% oxygen Intubation Type: IV induction Ventilation: Mask ventilation without difficulty Laryngoscope Size: Mac and 3 Grade View: Grade I Tube type: Oral Rae Tube size: 7.5 mm Number of attempts: 1 Airway Equipment and Method: Bougie stylet Placement Confirmation: ETT inserted through vocal cords under direct vision,  positive ETCO2 and breath sounds checked- equal and bilateral Tube secured with: Tape Dental Injury: Teeth and Oropharynx as per pre-operative assessment

## 2015-10-26 NOTE — Op Note (Signed)
PREOPERATIVE DIAGNOSIS:  Chronic nasal obstruction.  POSTOPERATIVE DIAGNOSIS:  Chronic nasal obstruction.  SURGEON:  Davina Pokehapman T. Layliana Devins, M.D.  NAME OF PROCEDURE:  1. Nasal septoplasty. 2. Submucous resection of inferior turbinates.  OPERATIVE FINDINGS:  Severe nasal septal deformity, hypertrophy of the inferior turbinates.   DESCRIPTION OF THE PROCEDURE:  Charles SauersMichael J Cunningham was identified in the holding area and taken to the operating room and placed in the supine position.  After general endotracheal anesthesia was induced, the table was turned 45 degrees and the patient was placed in a semi-Fowler position.  The nose was then topically anesthetized with Lidocaine, cotton pledgets were placed within each nostril. After approximately 5 minutes, this was removed at which time a local anesthetic of 1% Lidocaine 1:100,000 units of Epinephrine was used to inject the inferior turbinates in the nasal septum. A total of 12ml ml was used. Examination of the nose showed a severe left nasal septal deformity and tremendous hypertrophied inferior turbinate.  Beginning on the right hand side a hemitransfixion incision was then created on the leading edge of the septum on the right.  A subperichondrial plane was elevated posteriorly on the left and taken back to the perpendicular plate of the ethmoid where subperiosteal plane was elevated posteriorly on the left. A large septal spur was identified on the left hand side impacting on the inferior turbinate.  An inferior rim of cartilage was removed anteriorly with care taken to leave an anterior strut to prevent nasal collapse. With this strut removed the perpendicular plate of the ethmoid was separated from the quadrangular cartilage. The large septal spur was removed.  The septum was then replaced in the midline. Reinspection through each nostril showed excellent reduction of the septal deformity. A left posterior inferior fenestration was then created to allow  hematoma drainage.  With the septoplasty completed, beginning on the left-hand side, a 15 blade was used to incise along the inferior edge of the inferior turbinate. A superior laterally based flap was then elevated. The underlying conchal bone of mucosa was excised using Knight scissors. The flap was then laid back over the turbinate stump and cauterized using suction cautery. In a similar fashion the submucous resection was performed on the right.  With the submucous resection completed bilaterally and no active bleeding, the hemitransfixion incision was then closed using two interrupted 3-0 chromic sutures.  Plastic nasal septal splints were placed within each nostril and affixed to the septum using a 3-0 nylon suture. Stammberger was then used beneath each inferior turbinate for hemostasis.    The patient tolerated the procedure well, was returned to anesthesia, extubated in the operating room, and taken to the recovery room in stable condition.    CULTURES:  None.  SPECIMENS:  None.  ESTIMATED BLOOD LOSS:  25 cc.  Davina PokeMCQUEEN,Cathi Hazan T  10/26/2015  12:33 PM

## 2015-10-26 NOTE — H&P (Signed)
  H+P  Reviewed and will be scanned in later. No changes noted. 

## 2015-10-26 NOTE — Anesthesia Postprocedure Evaluation (Signed)
Anesthesia Post Note  Patient: [name]  Procedure(s) Performed: Procedure(s) (LRB): SEPTOPLASTY (Bilateral) TURBINATE REDUCTION/SUBMUCOSAL RESECTION (Bilateral)  Patient location during evaluation: PACU Anesthesia Type: General Level of consciousness: awake and alert Pain management: pain level controlled Vital Signs Assessment: post-procedure vital signs reviewed and stable Respiratory status: spontaneous breathing, nonlabored ventilation, respiratory function stable and patient connected to nasal cannula oxygen Cardiovascular status: blood pressure returned to baseline and stable Postop Assessment: no signs of nausea or vomiting Anesthetic complications: no    Chelcy Bolda

## 2015-10-29 ENCOUNTER — Encounter: Payer: Self-pay | Admitting: Unknown Physician Specialty

## 2015-12-21 ENCOUNTER — Ambulatory Visit: Payer: 59 | Admitting: Unknown Physician Specialty

## 2015-12-21 ENCOUNTER — Ambulatory Visit (INDEPENDENT_AMBULATORY_CARE_PROVIDER_SITE_OTHER): Payer: 59 | Admitting: Unknown Physician Specialty

## 2015-12-21 ENCOUNTER — Encounter: Payer: Self-pay | Admitting: Unknown Physician Specialty

## 2015-12-21 ENCOUNTER — Encounter (INDEPENDENT_AMBULATORY_CARE_PROVIDER_SITE_OTHER): Payer: Self-pay

## 2015-12-21 ENCOUNTER — Telehealth: Payer: Self-pay

## 2015-12-21 VITALS — BP 140/88 | HR 101 | Temp 98.2°F | Wt 230.2 lb

## 2015-12-21 DIAGNOSIS — L509 Urticaria, unspecified: Secondary | ICD-10-CM | POA: Diagnosis not present

## 2015-12-21 DIAGNOSIS — L739 Follicular disorder, unspecified: Secondary | ICD-10-CM | POA: Diagnosis not present

## 2015-12-21 MED ORDER — PREDNISONE 20 MG PO TABS: ORAL_TABLET | ORAL | 0 refills | Status: DC

## 2015-12-21 MED ORDER — DOXYCYCLINE HYCLATE 100 MG PO TABS: 100.0000 mg | ORAL_TABLET | Freq: Two times a day (BID) | ORAL | 0 refills | Status: DC

## 2015-12-21 MED ORDER — BETAMETHASONE DIPROPIONATE AUG 0.05 % EX CREA: TOPICAL_CREAM | Freq: Two times a day (BID) | CUTANEOUS | 0 refills | Status: DC

## 2015-12-21 MED ORDER — HYDROXYZINE HCL 10 MG PO TABS: 10.0000 mg | ORAL_TABLET | Freq: Three times a day (TID) | ORAL | 0 refills | Status: DC | PRN

## 2015-12-21 NOTE — Progress Notes (Signed)
   BP 140/88 (BP Location: Left Arm, Patient Position: Sitting, Cuff Size: Large)   Pulse (!) 101   Temp 98.2 F (36.8 C)   Wt 230 lb 3.2 oz (104.4 kg)   SpO2 96%   BMI 33.99 kg/m    Subjective:    Patient ID: [name], male    DOB: 1970-11-19, 45 y.o.   MRN: [id number]  HPI: [name] is a 45 y.o. male  Chief Complaint  Patient presents with  . Rash    pt states he has a rash from an allergic reaction he had from neoprene   Urticaria Pt states rash is ongoing for the last 5 days and getting worse.  It started on left wrist with a neoprene brace.  It has spread over chest, back abdomen, both arms, and lower legs.    Relevant past medical, surgical, family and social history reviewed and updated as indicated. Interim medical history since our last visit reviewed. Allergies and medications reviewed and updated.  Review of Systems  Per HPI unless specifically indicated above     Objective:    BP 140/88 (BP Location: Left Arm, Patient Position: Sitting, Cuff Size: Large)   Pulse (!) 101   Temp 98.2 F (36.8 C)   Wt 230 lb 3.2 oz (104.4 kg)   SpO2 96%   BMI 33.99 kg/m   Wt Readings from Last 3 Encounters:  12/21/15 230 lb 3.2 oz (104.4 kg)  10/26/15 201 lb (91.2 kg)  07/18/15 222 lb 6.4 oz (100.9 kg)    Physical Exam  Constitutional: He is oriented to person, place, and time. He appears well-developed and well-nourished. No distress.  HENT:  Head: Normocephalic and atraumatic.  Eyes: Conjunctivae and lids are normal. Right eye exhibits no discharge. Left eye exhibits no discharge. No scleral icterus.  Cardiovascular: Normal rate.   Pulmonary/Chest: Effort normal.  Abdominal: Normal appearance. There is no splenomegaly or hepatomegaly.  Musculoskeletal: Normal range of motion.  Neurological: He is alert and oriented to person, place, and time.  Skin: Skin is intact. No rash noted. No pallor.  Petechial rash over most of his body.      Psychiatric: He has a normal mood and affect. His behavior is normal. Judgment and thought content normal.    Results for orders placed or performed in visit on 07/18/15  Urinalysis, dipstick only  Result Value Ref Range   Specific Gravity, UA 1.020 1.005 - 1.030   pH, UA 5.0 5.0 - 7.5   Color, UA Yellow Yellow   Appearance Ur Clear Clear   Leukocytes, UA Negative Negative   Protein, UA Negative Negative/Trace   Glucose, UA Negative Negative   Ketones, UA Negative Negative   RBC, UA Negative Negative   Bilirubin, UA Negative Negative   Urobilinogen, Ur 0.2 0.2 - 1.0 mg/dL   Nitrite, UA Negative Negative      Assessment & Plan:   Problem List Items Addressed This Visit    None    Visit Diagnoses    Urticaria    -  Primary   Folliculitis          rx for Prednisone plus Doxycycline to r/o folliculitis.  Hydroxyzine for itching plus a high dose steroid cream  Follow up plan: Return if symptoms worsen or fail to improve.

## 2015-12-21 NOTE — Telephone Encounter (Signed)
Opened in error

## 2016-02-15 ENCOUNTER — Ambulatory Visit (INDEPENDENT_AMBULATORY_CARE_PROVIDER_SITE_OTHER): Payer: 59 | Admitting: Family Medicine

## 2016-02-15 ENCOUNTER — Encounter: Payer: Self-pay | Admitting: Family Medicine

## 2016-02-15 VITALS — BP 130/91 | HR 102 | Temp 98.6°F | Wt 228.6 lb

## 2016-02-15 DIAGNOSIS — R05 Cough: Secondary | ICD-10-CM | POA: Diagnosis not present

## 2016-02-15 DIAGNOSIS — R0981 Nasal congestion: Secondary | ICD-10-CM

## 2016-02-15 DIAGNOSIS — R059 Cough, unspecified: Secondary | ICD-10-CM

## 2016-02-15 LAB — VERITOR FLU A/B WAIVED
INFLUENZA A: NEGATIVE
Influenza B: NEGATIVE

## 2016-02-15 MED ORDER — HYDROCOD POLST-CPM POLST ER 10-8 MG/5ML PO SUER: 5.0000 mL | Freq: Every evening | ORAL | 0 refills | Status: DC | PRN

## 2016-02-15 MED ORDER — BENZONATATE 200 MG PO CAPS: 200.0000 mg | ORAL_CAPSULE | Freq: Two times a day (BID) | ORAL | 0 refills | Status: DC | PRN

## 2016-02-15 NOTE — Progress Notes (Signed)
BP (!) 130/91 (BP Location: Right Arm, Cuff Size: Large)   Pulse (!) 102   Temp 98.6 F (37 C)   Wt 228 lb 9.6 oz (103.7 kg)   SpO2 98%   BMI 33.76 kg/m    Subjective:    Patient ID: [name], male    DOB: [birth year], 46 y.o.   MRN: [id number]  HPI: [name] is a 46 y.o. male  Chief Complaint  Patient presents with  . URI    x7 days, states he has chest congestion and a cough. States he had a fever Saturday.    UPPER RESPIRATORY TRACT INFECTION Duration: 7 days Worst symptom: Cough  Fever: yes Cough: yes Shortness of breath: yes Wheezing: yes Chest pain: yes, with cough Chest tightness: yes Chest congestion: yes Nasal congestion: no Runny nose: no Post nasal drip: yes Sneezing: no Sore throat: no Swollen glands: no Sinus pressure: no Headache: no Face pain: no Toothache: no Ear pain: no  Ear pressure: yes "right Eyes red/itching: yes Eye drainage/crusting: yes  Vomiting: no Rash: no Fatigue: yes Sick contacts: yes Strep contacts: no  Context: better Recurrent sinusitis: no Relief with OTC cold/cough medications: no  Treatments attempted: prednisone- medrol dose pack, done now, cough syrup and antibiotics- cefdinir 300mg  BID   Relevant past medical, surgical, family and social history reviewed and updated as indicated. Interim medical history since our last visit reviewed. Allergies and medications reviewed and updated.  Review of Systems  Constitutional: Negative.   HENT: Negative.   Respiratory: Positive for cough, shortness of breath and wheezing. Negative for apnea, choking, chest tightness and stridor.   Cardiovascular: Negative.   Psychiatric/Behavioral: Negative.     Per HPI unless specifically indicated above     Objective:    BP (!) 130/91 (BP Location: Right Arm, Cuff Size: Large)   Pulse (!) 102   Temp 98.6 F (37 C)   Wt 228 lb 9.6 oz (103.7 kg)   SpO2 98%   BMI 33.76 kg/m   Wt Readings from Last 3  Encounters:  02/15/16 228 lb 9.6 oz (103.7 kg)  12/21/15 230 lb 3.2 oz (104.4 kg)  10/26/15 201 lb (91.2 kg)    Physical Exam  Constitutional: He is oriented to person, place, and time. He appears well-developed and well-nourished. No distress.  HENT:  Head: Normocephalic and atraumatic.  Right Ear: Hearing and external ear normal.  Left Ear: Hearing and external ear normal.  Nose: Nose normal.  Mouth/Throat: Oropharynx is clear and moist. No oropharyngeal exudate.  Eyes: Conjunctivae, EOM and lids are normal. Pupils are equal, round, and reactive to light. Right eye exhibits no discharge. Left eye exhibits no discharge. No scleral icterus.  Neck: Normal range of motion. Neck supple. No JVD present. No tracheal deviation present. No thyromegaly present.  Cardiovascular: Normal rate, regular rhythm, normal heart sounds and intact distal pulses.  Exam reveals no gallop and no friction rub.   No murmur heard. Pulmonary/Chest: Effort normal and breath sounds normal. No stridor. No respiratory distress. He has no wheezes. He has no rales. He exhibits no tenderness.  Musculoskeletal: Normal range of motion.  Lymphadenopathy:    He has no cervical adenopathy.  Neurological: He is alert and oriented to person, place, and time.  Skin: Skin is warm, dry and intact. No rash noted. He is not diaphoretic. No erythema. No pallor.  Psychiatric: He has a normal mood and affect. His speech is normal and behavior is normal. Judgment  and thought content normal. Cognition and memory are normal.  Nursing note and vitals reviewed.   Results for orders placed or performed in visit on 07/18/15  Urinalysis, dipstick only  Result Value Ref Range   Specific Gravity, UA 1.020 1.005 - 1.030   pH, UA 5.0 5.0 - 7.5   Color, UA Yellow Yellow   Appearance Ur Clear Clear   Leukocytes, UA Negative Negative   Protein, UA Negative Negative/Trace   Glucose, UA Negative Negative   Ketones, UA Negative Negative   RBC,  UA Negative Negative   Bilirubin, UA Negative Negative   Urobilinogen, Ur 0.2 0.2 - 1.0 mg/dL   Nitrite, UA Negative Negative      Assessment & Plan:   Problem List Items Addressed This Visit    None    Visit Diagnoses    Cough    -  Primary   Adequately treated. Lungs clear. Tussionex and tessalon perles. Call if not getting better or getting worse. Discussed that coughs can last up to 6 weeks.   Congestion of nasal sinus       Flu negative.    Relevant Orders   Veritor Flu A/B Waived       Follow up plan: Return if symptoms worsen or fail to improve.

## 2016-02-19 ENCOUNTER — Telehealth: Payer: Self-pay | Admitting: Family Medicine

## 2016-02-19 NOTE — Telephone Encounter (Signed)
I already gave him a Rx for tussionex. There is nothing else I can call in.

## 2016-02-19 NOTE — Telephone Encounter (Signed)
Patient was not given the prescription while here on Friday, he will come by the office to pick it up.

## 2016-06-25 IMAGING — MR MR CERVICAL SPINE W/O CM
4 of 5 series · 23 of 48 positions shown · non-contrast
Comparison: Prior MRI from 07/01/2005.

CLINICAL DATA: Three months history of neck pain. Numbness in
fingers of left hand for couple of years. Question spinal stenosis
at C3-4.

EXAM:
MRI CERVICAL SPINE WITHOUT CONTRAST
TECHNIQUE: Multiplanar, multisequence MR imaging of the cervical spine was
performed. No intravenous contrast was administered.

[Series 2: T2 · sagittal · 3.3mm · 0.43mm/px · 6 of 12 slices shown (1 of 2)]
[im 1/12]
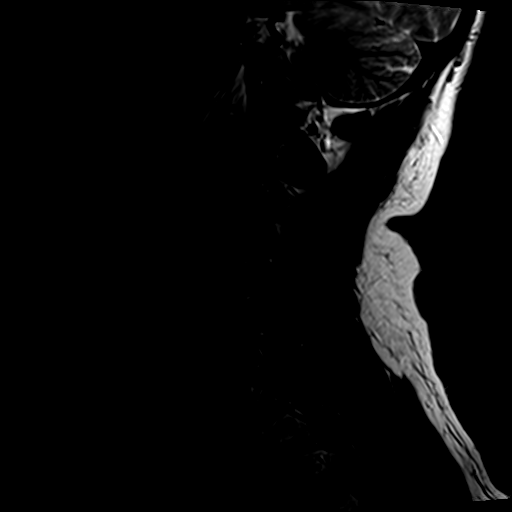
[im 3/12]
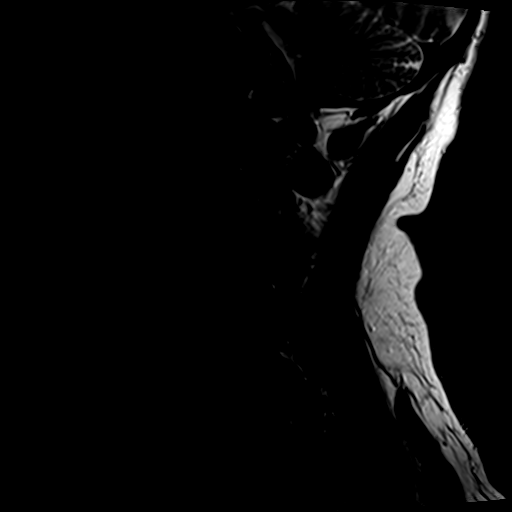
[im 5/12]
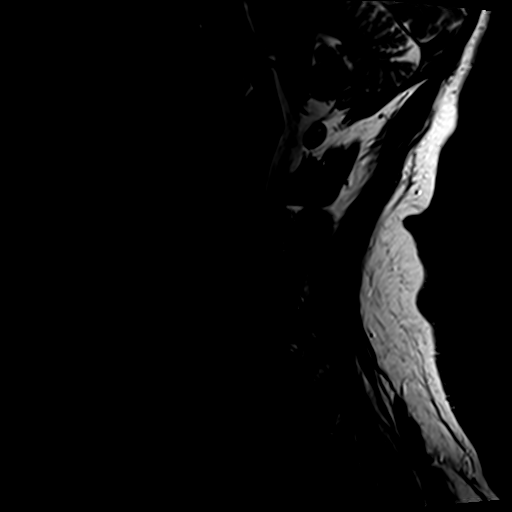
[im 7/12]
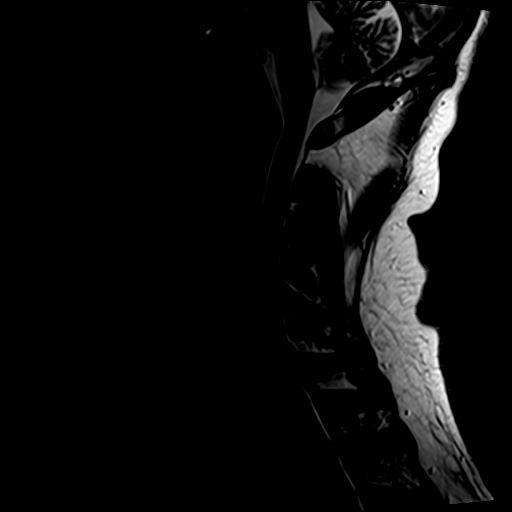
[im 9/12]
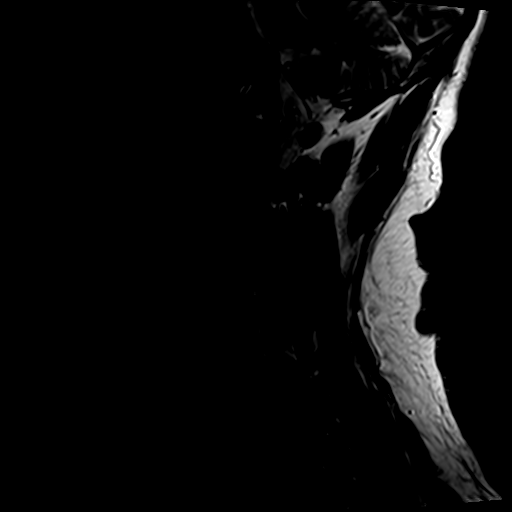
[im 12/12]
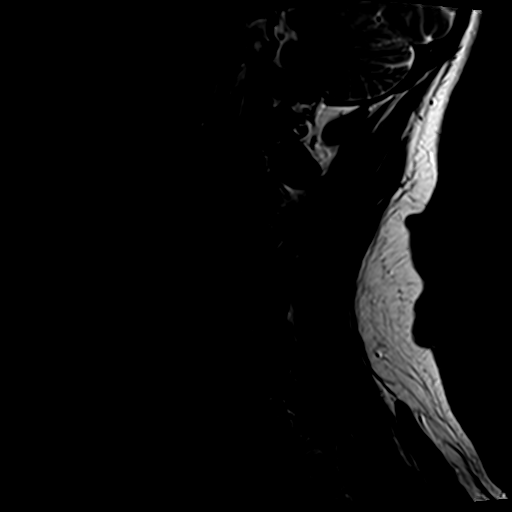

[Series 3: T1 · sagittal · 3.3mm · 0.43mm/px · 6 of 12 slices shown]
[im 1/12]
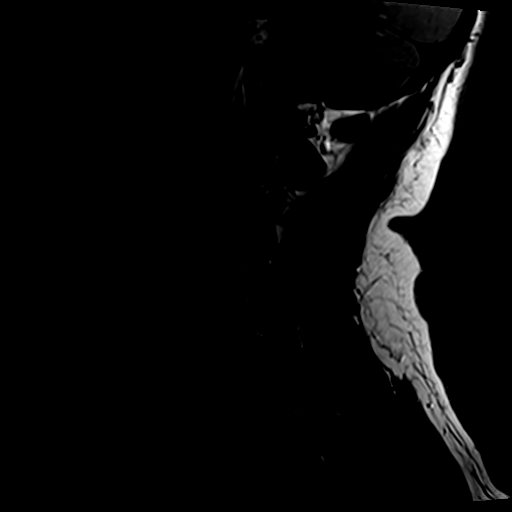
[im 2/12]
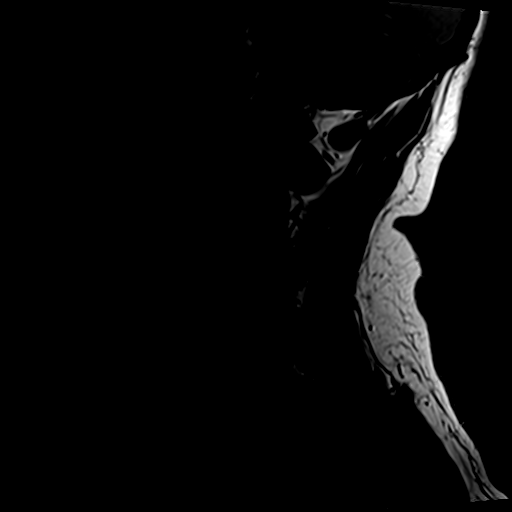
[im 4/12]
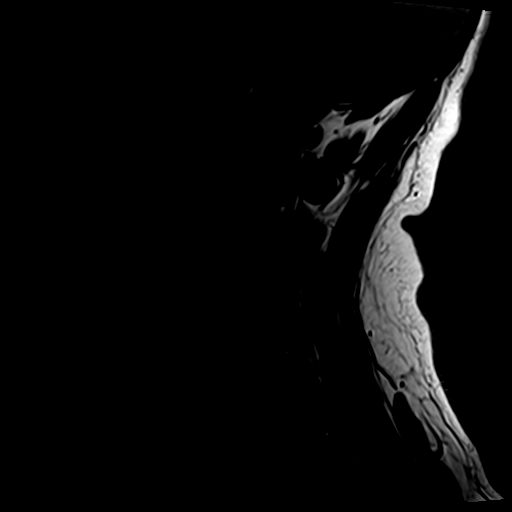
[im 6/12]
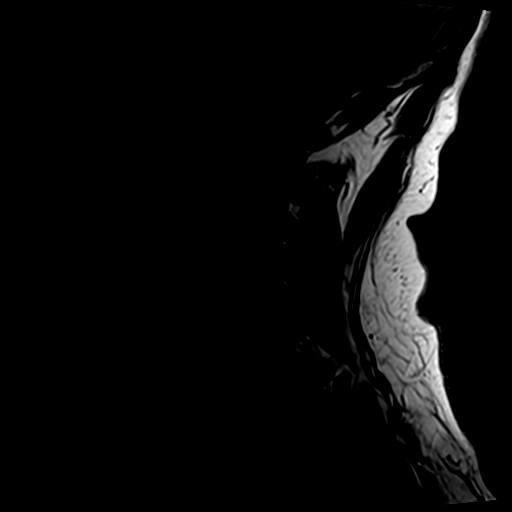
[im 8/12]
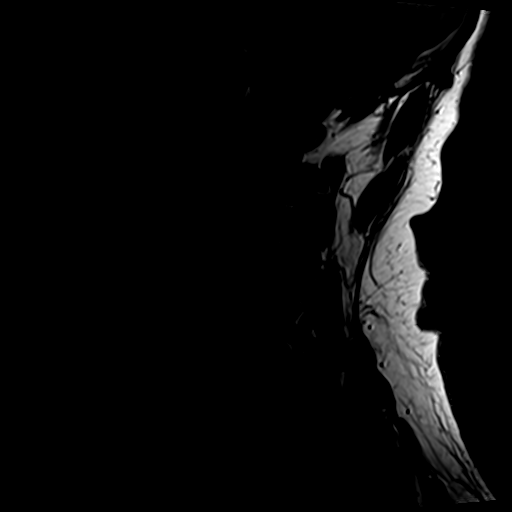
[im 10/12]
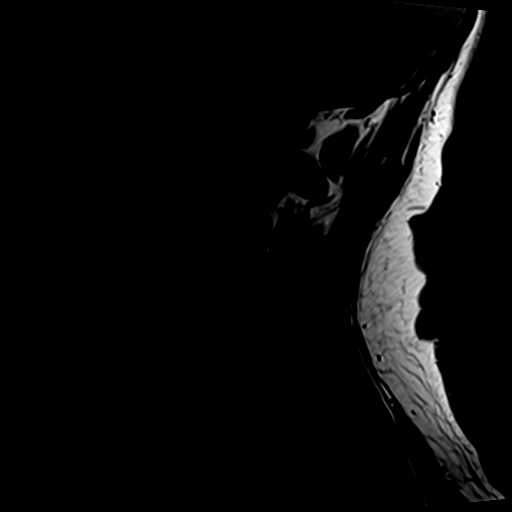

[Series 4: tir sag · sagittal · 3.3mm · 0.43mm/px · 3 of 12 slices shown]
[im 2/12]
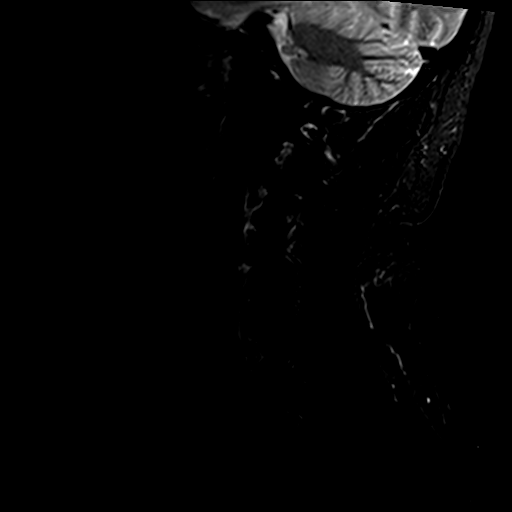
[im 6/12]
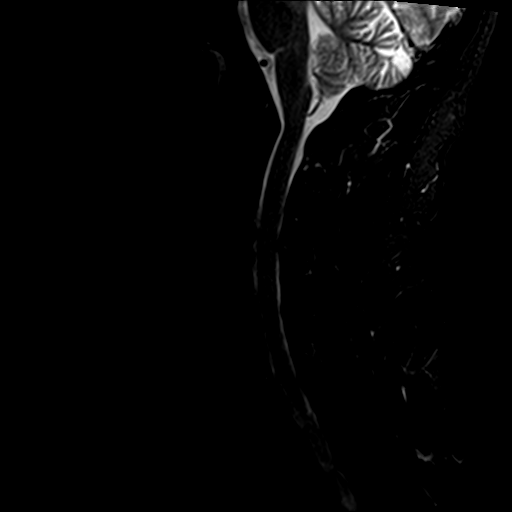
[im 10/12]
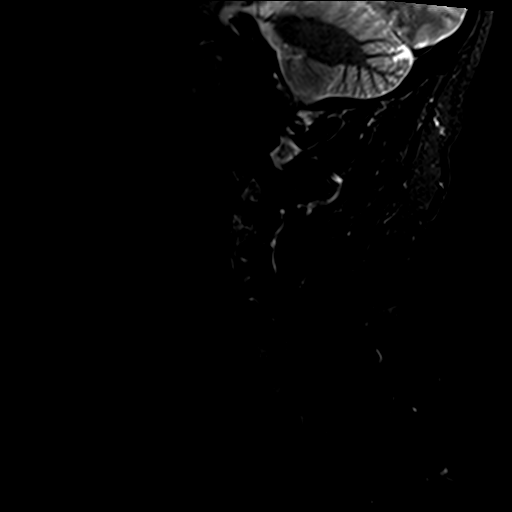

[Series 6: T2 · axial · 3.0mm · 0.70mm/px · z∈[-89,+7]mm · 8 of 26 slices shown (2 of 2)]
[im 1/26]
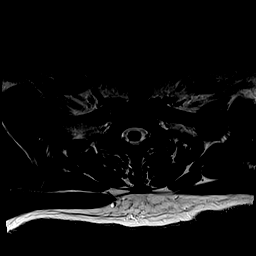
[im 4/26]
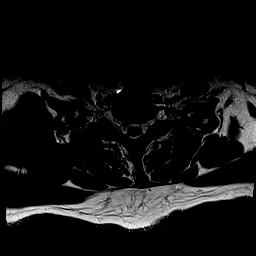
[im 8/26]
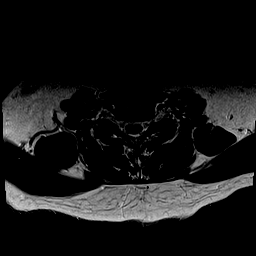
[im 12/26]
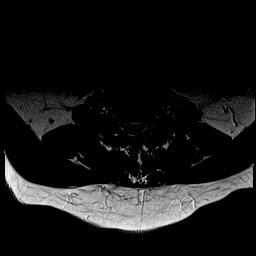
[im 14/26]
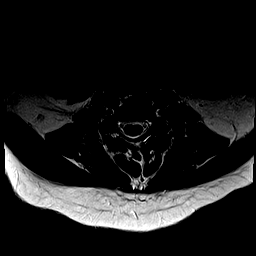
[im 18/26]
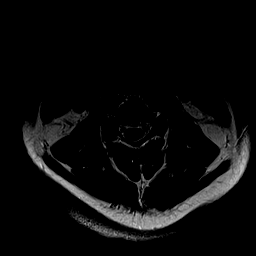
[im 22/26]
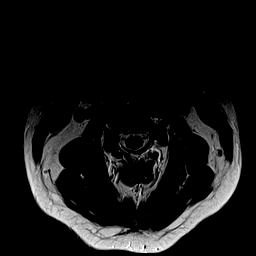
[im 26/26]
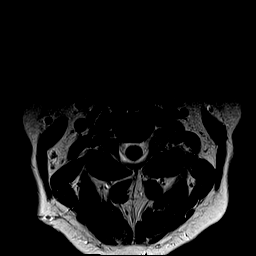

[23 of 48 positions shown; findings below may reference images not displayed]

FINDINGS: The visualized portions of the brain and posterior fossa demonstrate
a normal appearance with normal signal intensity. Craniocervical
junction is widely patent.

Vertebral bodies are normally aligned with preservation of the
normal cervical lordosis. No listhesis.

Patient is status post ACDF at the C6-7 levels. Hardware is well
positioned. There appears to be complete ankylosis of the C6 and C7
vertebral bodies.

Signal intensity within the cervical spinal cord is normal.

Paravertebral soft tissues are unremarkable. Normal flow voids seen
within the major arterial vasculature of the neck bilaterally.

C2-3: Mild left-sided uncovertebral spurring and facet arthrosis
without canal or foraminal stenosis.

C3-4: There is diffuse degenerative disc osteophyte complex with
bilateral uncovertebral spurring and mild bilateral facet arthrosis.
A more focal right foraminal degenerative disc osteophyte complex
results in moderate right foraminal stenosis (series 4, image 5).
Posterior disc osteophyte partially indents the ventral thecal sac
and results in mild canal stenosis. There is mild left foraminal
narrowing as well due to mild left side of uncovertebral spurring.
Minimal flattening of the right ventral aspect of the thecal sac.

C4-5: Diffuse degenerative disc osteophyte with mild bilateral
uncovertebral spurring and facet arthrosis. There is resultant mild
left foraminal stenosis. Central canal and right neural foramina are
widely patent.

C5-6: Diffuse degenerative disc osteophyte with bilateral
uncovertebral spurring and facet arthrosis, left greater than right.
There is severe left with moderate right foraminal narrowing.
Posterior disc osteophyte flattens and partially effaces the ventral
thecal sac and results in mild canal stenosis.

C6-7: Status post ACDF. Mild bilateral uncovertebral spurring
present without significant stenosis.

C7-T1: Intervertebral disc is normal. No significant canal or
foraminal stenosis.
IMPRESSION: 1. Degenerative disc osteophyte at C3-4 as above with resultant mild
canal and moderate right foraminal stenosis.
2. Severe degenerative disc osteophyte at C5-6 with resultant severe
left and moderate right foraminal stenosis with mild central canal
narrowing.
3. Mild left-sided uncovertebral spurring at C4-5 with resultant
mild left foraminal stenosis.
4. Status post ACDF at C6-7.

## 2016-07-21 ENCOUNTER — Encounter: Payer: Self-pay | Admitting: Family Medicine

## 2016-07-21 ENCOUNTER — Ambulatory Visit (INDEPENDENT_AMBULATORY_CARE_PROVIDER_SITE_OTHER): Payer: 59 | Admitting: Family Medicine

## 2016-07-21 VITALS — BP 137/96 | HR 105 | Temp 98.3°F | Wt 234.0 lb

## 2016-07-21 DIAGNOSIS — W57XXXA Bitten or stung by nonvenomous insect and other nonvenomous arthropods, initial encounter: Secondary | ICD-10-CM

## 2016-07-21 DIAGNOSIS — S30860A Insect bite (nonvenomous) of lower back and pelvis, initial encounter: Secondary | ICD-10-CM | POA: Diagnosis not present

## 2016-07-21 DIAGNOSIS — L309 Dermatitis, unspecified: Secondary | ICD-10-CM

## 2016-07-21 DIAGNOSIS — R42 Dizziness and giddiness: Secondary | ICD-10-CM | POA: Diagnosis not present

## 2016-07-21 MED ORDER — CLOBETASOL PROPIONATE 0.05 % EX OINT: 1.0000 "application " | TOPICAL_OINTMENT | Freq: Two times a day (BID) | CUTANEOUS | 0 refills | Status: DC

## 2016-07-21 NOTE — Progress Notes (Signed)
BP (!) 137/96   Pulse (!) 105   Temp 98.3 F (36.8 C)   Wt 234 lb (106.1 kg)   SpO2 96%   BMI 34.56 kg/m    Subjective:    Patient ID: [name], male    DOB: [birth year], 46 y.o.   MRN: [id number]  HPI: [name] is a 46 y.o. male  Chief Complaint  Patient presents with  . Rash    right hand, off and on for a while, worse over last 8 weeks. Itches, dry skin, skin cracks open. Diprolene cream helped in the past.  . Dizziness    once in a while, will close his eyes and feel a little dizzy. Feels off balance, swimmy headed. Room doesn't spin.   . Tick Bite    was bit yesterday, had white spot on the ticks back. Swollen, red, size of a quarter, itches.    Pt with a few dry patches on right hand, worst one at base of 5th digit down side of hand with large cracking. Itches and burns sometimes. Has been putting neosporin and a band aid on the area. Does have an intermittent hx of this happening. Current episode started about 2 months ago.   Also notes intermittent, infrequent dizzy spells when he closes his eyes momentarily where he feels a bit off balance. No nausea, headaches, syncope, falls, and resolves when he opens his eyes. Doesn't appear to be positional.  Found a large tick on him yesterday and the area is now red and swollen and significantly itchy. Denies any diffuse rash or target lesions, fever, chills, body aches, joint pains, HAs. Not putting anything on the area.   Past Medical History:  Diagnosis Date  . DDD (degenerative disc disease), lumbar   . Dental bridge present    permanant - top left  . Headache    migraines - pinched nerve in neck - no mvmt limitations  . Hernia July 2014   umbilical  . Hypertension    Social History   Social History  . Marital status: Married    Spouse name: N/A  . Number of children: 2  . Years of education: N/A   Occupational History  . Not on file.   Social History Main Topics  . Smoking status:  Never Smoker  . Smokeless tobacco: Never Used  . Alcohol use No  . Drug use: No  . Sexual activity: Yes   Other Topics Concern  . Not on file   Social History Narrative  . No narrative on file   Relevant past medical, surgical, family and social history reviewed and updated as indicated. Interim medical history since our last visit reviewed. Allergies and medications reviewed and updated.  Review of Systems  Constitutional: Negative.   HENT: Negative.   Eyes: Negative.   Respiratory: Negative.   Cardiovascular: Negative.   Gastrointestinal: Negative.   Genitourinary: Negative.   Musculoskeletal: Negative.   Skin: Positive for rash.  Neurological: Positive for dizziness.  Psychiatric/Behavioral: Negative.     Per HPI unless specifically indicated above     Objective:    BP (!) 137/96   Pulse (!) 105   Temp 98.3 F (36.8 C)   Wt 234 lb (106.1 kg)   SpO2 96%   BMI 34.56 kg/m   Wt Readings from Last 3 Encounters:  07/21/16 234 lb (106.1 kg)  02/15/16 228 lb 9.6 oz (103.7 kg)  12/21/15 230 lb 3.2 oz (104.4 kg)  Physical Exam  Constitutional: He is oriented to person, place, and time. He appears well-developed and well-nourished. No distress.  HENT:  Head: Atraumatic.  Right Ear: External ear normal.  Left Ear: External ear normal.  Nose: Nose normal.  Mouth/Throat: Oropharynx is clear and moist.  Eyes: Conjunctivae are normal. Pupils are equal, round, and reactive to light.  Neck: Normal range of motion. Neck supple.  Cardiovascular: Normal rate and normal heart sounds.   Pulmonary/Chest: Effort normal and breath sounds normal. No respiratory distress.  Musculoskeletal: Normal range of motion.  Neurological: He is alert and oriented to person, place, and time. No cranial nerve deficit. Coordination normal.  Skin: Skin is warm and dry. Rash (Eczema patches on right hand with some cracking areas) noted.  Well healing tick bite wound  Psychiatric: He has a  normal mood and affect. His behavior is normal.  Nursing note and vitals reviewed.     Assessment & Plan:   Problem List Items Addressed This Visit    None    Visit Diagnoses    Hand dermatitis    -  Primary   Clobetasol ointment sent. Discussed avoiding hot water, irritating soaps and lotions, and keeping a thick moisturizer applied BID.    Dizziness       Mild and infrequent, discussed possibly d/t elevated BPs lately. Offered to try meclizine or BP med but not wanting to take any medicines. Will monitor closely   Tick bite of buttock, initial encounter       Non-infected and not suspicious appearing at this time. Can use clobetasol for itch and inflammation, discussed warning signs for tick illness to watch for       Follow up plan: Return if symptoms worsen or fail to improve.

## 2016-09-27 ENCOUNTER — Ambulatory Visit
Admission: RE | Admit: 2016-09-27 | Discharge: 2016-09-27 | Disposition: A | Discharge status: Home / Self Care | Payer: 59 | Source: Ambulatory Visit | Attending: Nurse Practitioner | Admitting: Nurse Practitioner

## 2016-09-27 ENCOUNTER — Other Ambulatory Visit: Payer: Self-pay | Admitting: Nurse Practitioner

## 2016-09-27 DIAGNOSIS — M5020 Other cervical disc displacement, unspecified cervical region: Secondary | ICD-10-CM | POA: Insufficient documentation

## 2016-09-27 DIAGNOSIS — M546 Pain in thoracic spine: Secondary | ICD-10-CM

## 2016-09-27 DIAGNOSIS — M47814 Spondylosis without myelopathy or radiculopathy, thoracic region: Secondary | ICD-10-CM | POA: Insufficient documentation

## 2016-09-27 DIAGNOSIS — M4302 Spondylolysis, cervical region: Secondary | ICD-10-CM | POA: Insufficient documentation

## 2016-10-23 ENCOUNTER — Other Ambulatory Visit: Payer: Self-pay | Admitting: Neurosurgery

## 2016-10-23 DIAGNOSIS — M5412 Radiculopathy, cervical region: Secondary | ICD-10-CM

## 2016-10-30 ENCOUNTER — Ambulatory Visit
Admission: RE | Admit: 2016-10-30 | Discharge: 2016-10-30 | Disposition: A | Discharge status: Home / Self Care | Payer: 59 | Source: Ambulatory Visit | Attending: Neurosurgery | Admitting: Neurosurgery

## 2016-10-30 DIAGNOSIS — M4802 Spinal stenosis, cervical region: Secondary | ICD-10-CM | POA: Diagnosis not present

## 2016-10-30 DIAGNOSIS — M5412 Radiculopathy, cervical region: Secondary | ICD-10-CM | POA: Diagnosis present

## 2016-10-30 DIAGNOSIS — M50122 Cervical disc disorder at C5-C6 level with radiculopathy: Secondary | ICD-10-CM | POA: Insufficient documentation

## 2016-12-15 ENCOUNTER — Other Ambulatory Visit: Payer: Self-pay | Admitting: Neurosurgery

## 2016-12-15 DIAGNOSIS — M4716 Other spondylosis with myelopathy, lumbar region: Secondary | ICD-10-CM

## 2016-12-15 DIAGNOSIS — M5020 Other cervical disc displacement, unspecified cervical region: Secondary | ICD-10-CM

## 2017-03-13 ENCOUNTER — Other Ambulatory Visit: Payer: Self-pay | Admitting: Neurosurgery

## 2017-03-13 ENCOUNTER — Ambulatory Visit
Admission: RE | Admit: 2017-03-13 | Discharge: 2017-03-13 | Disposition: A | Discharge status: Home / Self Care | Payer: 59 | Source: Ambulatory Visit | Attending: Neurosurgery | Admitting: Neurosurgery

## 2017-03-13 DIAGNOSIS — M5020 Other cervical disc displacement, unspecified cervical region: Secondary | ICD-10-CM

## 2017-03-13 DIAGNOSIS — M4716 Other spondylosis with myelopathy, lumbar region: Secondary | ICD-10-CM

## 2017-03-13 MED ORDER — IOPAMIDOL (ISOVUE-M 300) INJECTION 61%
1.0000 mL | Freq: Once | INTRAMUSCULAR | Status: AC | PRN
  Administered 2017-03-13: 1 mL via EPIDURAL

## 2017-03-13 MED ORDER — TRIAMCINOLONE ACETONIDE 40 MG/ML IJ SUSP (RADIOLOGY)
60.0000 mg | Freq: Once | INTRAMUSCULAR | Status: AC
  Administered 2017-03-13: 60 mg via EPIDURAL

## 2017-03-13 NOTE — Discharge Instructions (Signed)

## 2017-03-31 ENCOUNTER — Other Ambulatory Visit: Payer: Self-pay | Admitting: Neurosurgery

## 2017-03-31 DIAGNOSIS — M4716 Other spondylosis with myelopathy, lumbar region: Secondary | ICD-10-CM

## 2017-04-01 ENCOUNTER — Ambulatory Visit
Admission: RE | Admit: 2017-04-01 | Discharge: 2017-04-01 | Disposition: A | Discharge status: Home / Self Care | Payer: 59 | Source: Ambulatory Visit | Attending: Neurosurgery | Admitting: Neurosurgery

## 2017-04-01 DIAGNOSIS — M4716 Other spondylosis with myelopathy, lumbar region: Secondary | ICD-10-CM

## 2017-04-06 ENCOUNTER — Other Ambulatory Visit: Payer: Self-pay | Admitting: Nurse Practitioner

## 2017-04-06 DIAGNOSIS — M4716 Other spondylosis with myelopathy, lumbar region: Secondary | ICD-10-CM

## 2017-04-17 ENCOUNTER — Ambulatory Visit
Admission: RE | Admit: 2017-04-17 | Discharge: 2017-04-17 | Disposition: A | Discharge status: Home / Self Care | Payer: 59 | Source: Ambulatory Visit | Attending: Nurse Practitioner | Admitting: Nurse Practitioner

## 2017-04-17 DIAGNOSIS — M4716 Other spondylosis with myelopathy, lumbar region: Secondary | ICD-10-CM

## 2017-04-17 MED ORDER — IOPAMIDOL (ISOVUE-M 200) INJECTION 41%
1.0000 mL | Freq: Once | INTRAMUSCULAR | Status: AC
  Administered 2017-04-17: 1 mL via EPIDURAL

## 2017-04-17 MED ORDER — METHYLPREDNISOLONE ACETATE 40 MG/ML INJ SUSP (RADIOLOG
120.0000 mg | Freq: Once | INTRAMUSCULAR | Status: AC
  Administered 2017-04-17: 120 mg via EPIDURAL

## 2017-06-08 ENCOUNTER — Other Ambulatory Visit: Payer: Self-pay | Admitting: Neurosurgery

## 2017-06-08 ENCOUNTER — Encounter: Payer: Self-pay | Admitting: *Deleted

## 2017-06-08 ENCOUNTER — Emergency Department: Payer: Commercial Managed Care - HMO

## 2017-06-08 ENCOUNTER — Emergency Department
Admission: EM | Admit: 2017-06-08 | Discharge: 2017-06-09 | Disposition: A | Discharge status: Home / Self Care | Payer: Commercial Managed Care - HMO | Attending: Emergency Medicine | Admitting: Emergency Medicine

## 2017-06-08 ENCOUNTER — Other Ambulatory Visit: Payer: Self-pay

## 2017-06-08 DIAGNOSIS — Z79899 Other long term (current) drug therapy: Secondary | ICD-10-CM | POA: Insufficient documentation

## 2017-06-08 DIAGNOSIS — M5126 Other intervertebral disc displacement, lumbar region: Secondary | ICD-10-CM

## 2017-06-08 DIAGNOSIS — M545 Low back pain: Secondary | ICD-10-CM | POA: Diagnosis present

## 2017-06-08 DIAGNOSIS — R509 Fever, unspecified: Secondary | ICD-10-CM

## 2017-06-08 DIAGNOSIS — M5416 Radiculopathy, lumbar region: Secondary | ICD-10-CM | POA: Diagnosis not present

## 2017-06-08 DIAGNOSIS — I10 Essential (primary) hypertension: Secondary | ICD-10-CM | POA: Diagnosis not present

## 2017-06-08 DIAGNOSIS — Z91040 Latex allergy status: Secondary | ICD-10-CM | POA: Insufficient documentation

## 2017-06-08 LAB — URINALYSIS, COMPLETE (UACMP) WITH MICROSCOPIC
Bacteria, UA: NONE SEEN
Bilirubin Urine: NEGATIVE
GLUCOSE, UA: NEGATIVE mg/dL
KETONES UR: NEGATIVE mg/dL
Leukocytes, UA: NEGATIVE
NITRITE: NEGATIVE
PH: 5 (ref 5.0–8.0)
PROTEIN: NEGATIVE mg/dL
Specific Gravity, Urine: 1.02 (ref 1.005–1.030)

## 2017-06-08 LAB — COMPREHENSIVE METABOLIC PANEL
ALT: 36 U/L (ref 17–63)
AST: 27 U/L (ref 15–41)
Albumin: 4.3 g/dL (ref 3.5–5.0)
Alkaline Phosphatase: 84 U/L (ref 38–126)
Anion gap: 10 (ref 5–15)
BILIRUBIN TOTAL: 1.1 mg/dL (ref 0.3–1.2)
BUN: 16 mg/dL (ref 6–20)
CO2: 26 mmol/L (ref 22–32)
CREATININE: 1 mg/dL (ref 0.61–1.24)
Calcium: 8.9 mg/dL (ref 8.9–10.3)
Chloride: 100 mmol/L — ABNORMAL LOW (ref 101–111)
Glucose, Bld: 126 mg/dL — ABNORMAL HIGH (ref 65–99)
POTASSIUM: 4.3 mmol/L (ref 3.5–5.1)
Sodium: 136 mmol/L (ref 135–145)
TOTAL PROTEIN: 7.6 g/dL (ref 6.5–8.1)

## 2017-06-08 LAB — CBC WITH DIFFERENTIAL/PLATELET
BASOS ABS: 0 10*3/uL (ref 0–0.1)
Basophils Relative: 0 %
EOS ABS: 0 10*3/uL (ref 0–0.7)
Eosinophils Relative: 0 %
HCT: 49.2 % (ref 40.0–52.0)
HEMOGLOBIN: 16.7 g/dL (ref 13.0–18.0)
LYMPHS ABS: 0.3 10*3/uL — AB (ref 1.0–3.6)
Lymphocytes Relative: 3 %
MCH: 31.7 pg (ref 26.0–34.0)
MCHC: 33.9 g/dL (ref 32.0–36.0)
MCV: 93.5 fL (ref 80.0–100.0)
Monocytes Absolute: 0.4 10*3/uL (ref 0.2–1.0)
Monocytes Relative: 4 %
NEUTROS PCT: 93 %
Neutro Abs: 9.9 10*3/uL — ABNORMAL HIGH (ref 1.4–6.5)
PLATELETS: 265 10*3/uL (ref 150–440)
RBC: 5.26 MIL/uL (ref 4.40–5.90)
RDW: 13.4 % (ref 11.5–14.5)
WBC: 10.7 10*3/uL — AB (ref 3.8–10.6)

## 2017-06-08 MED ORDER — KETOROLAC TROMETHAMINE 30 MG/ML IJ SOLN
30.0000 mg | Freq: Once | INTRAMUSCULAR | Status: AC
  Administered 2017-06-08: 30 mg via INTRAVENOUS
  Filled 2017-06-08: qty 1

## 2017-06-08 MED ORDER — ACETAMINOPHEN 325 MG PO TABS
ORAL_TABLET | ORAL | Status: AC
  Administered 2017-06-08: 650 mg via ORAL
  Filled 2017-06-08: qty 2

## 2017-06-08 MED ORDER — ACETAMINOPHEN 325 MG PO TABS
650.0000 mg | ORAL_TABLET | Freq: Once | ORAL | Status: AC
  Administered 2017-06-08: 650 mg via ORAL

## 2017-06-08 MED ORDER — MORPHINE SULFATE (PF) 4 MG/ML IV SOLN
4.0000 mg | Freq: Once | INTRAVENOUS | Status: AC
  Administered 2017-06-08: 4 mg via INTRAVENOUS
  Filled 2017-06-08: qty 1

## 2017-06-08 NOTE — ED Notes (Signed)
Patient transported to CT at this time. 

## 2017-06-08 NOTE — ED Notes (Signed)
Pt reports he may have lifted something heavy about a week ago that caused some of his pain.

## 2017-06-08 NOTE — ED Triage Notes (Addendum)
Pt reports generalized back pain (mainly in between shoulder blades and lower back), since early this morning. States numbness in both of his legs that started last Monday. Pt says on Monday he had some difficulty trying to urinate. Started with having chills this afternoon.   Pt saw Dr. Channing Mutters with [facility] for the same today, given RX for steroids and muscle relaxer, scheduled to have a CT scan next week for further evaluation. Pt reports hx of ruptured disk in c4/5 and also in the lower back.

## 2017-06-08 NOTE — ED Provider Notes (Signed)
[facility] Emergency Department Provider Note   ____________________________________________   First MD Initiated Contact with Patient 06/08/17 2329     (approximate)  I have reviewed the triage vital signs and the nursing notes.   HISTORY  Chief Complaint Back Pain    HPI SERGE MAIN is a 47 y.o. male Who comes into the hospital today with some severe back pain.  He has been having the symptoms for about a week.  The pain goes down both of his legs.  The patient saw his neurosurgeon today with a plan to get a CT scan of his lumbar spine.  He had an MRI and they know that he has a bulging disc. The patient though started having some fever at home to 101.7 The patient had some shakes chills and aches that then radiated down his back.  His wife states that he has not really eaten well and has felt bad.  The back pain has eased off a time or 2 but it keeps coming back and he states that severe at this time.  They contacted his neurosurgeon and they were told to come to the nearest hospital to see what was going on.  The patient denies any nausea, vomiting, pain with urination, cough, cold, runny nose.  Today he was prescribed a steroid pack and a muscle relaxer.  He did not take the pain medicine but did take some Tylenol.  The patient rates his pain a 10 out of 10 in intensity.  Numbness and tingling in his legs as well as some weakness which was his reason for evaluation by the neurosurgeon.  He denies any fecal incontinence but did have to sit down to use the restroom and urinate today.  He is here today for evaluation.   Past Medical History:  Diagnosis Date  . DDD (degenerative disc disease), lumbar   . Dental bridge present    permanant - top left  . Headache    migraines - pinched nerve in neck - no mvmt limitations  . Hernia July 2014   umbilical  . Hypertension     Patient Active Problem List   Diagnosis Date Noted  . Rhinitis  medicamentosa 07/06/2015  . DDD (degenerative disc disease), lumbar 07/20/2014  . Hyperlipidemia 07/20/2014  . Hypertension 07/20/2014  . DDD (degenerative disc disease), cervical 07/20/2014  . Umbilical hernia 08/31/2012    Past Surgical History:  Procedure Laterality Date  . BACK SURGERY  2003, 2007, 2009, 2012   Dr Trey Sailors  . HERNIA REPAIR  2014   umbilical hernia  . KNEE SURGERY Right 1997, 2001   torn miniscus  . LUMBAR DISC SURGERY     X 3 with hardware  . NASAL TURBINATE REDUCTION Bilateral 10/26/2015   Procedure: TURBINATE REDUCTION/SUBMUCOSAL RESECTION;  Surgeon: Linus Salmons, MD;  Location: [facility];  Service: ENT;  Laterality: Bilateral;  . SEPTOPLASTY Bilateral 10/26/2015   Procedure: SEPTOPLASTY;  Surgeon: Linus Salmons, MD;  Location: [facility];  Service: ENT;  Laterality: Bilateral;    Prior to Admission medications   Medication Sig Start Date End Date Taking? Authorizing Provider  clobetasol ointment (TEMOVATE) 0.05 % Apply 1 application topically 2 (two) times daily. 07/21/16   Particia Nearing, PA-C  cyclobenzaprine (FLEXERIL) 10 MG tablet Take 10 mg by mouth 3 (three) times daily as needed for muscle spasms.    [provider]  HYDROcodone-acetaminophen (NORCO/VICODIN) 5-325 MG tablet Take 1 tablet by mouth every 8 (  eight) hours as needed for moderate pain.    [provider]  isometheptene-acetaminophen-dichloralphenazone (MIDRIN) [phone] MG capsule Take 1 capsule by mouth 4 (four) times daily as needed for migraine. Maximum 5 capsules in 12 hours for migraine headaches, 8 capsules in 24 hours for tension headaches.    [provider]    Allergies Other; Amlodipine; Celebrex [celecoxib]; and Latex  Family History  Problem Relation Age of Onset  . Heart Problems Father   . Cancer Mother        pancreatic  . Heart Problems Maternal Grandfather   . Heart Problems Paternal Grandfather     Social  History Social History   Tobacco Use  . Smoking status: Never Smoker  . Smokeless tobacco: Never Used  Substance Use Topics  . Alcohol use: No  . Drug use: No    Review of Systems  Constitutional: fever/chills Eyes: No visual changes. ENT: No sore throat. Cardiovascular: Denies chest pain. Respiratory: Denies shortness of breath. Gastrointestinal: No abdominal pain.  No nausea, no vomiting.  No diarrhea.  No constipation. Genitourinary: Negative for dysuria. Musculoskeletal:  back pain. Skin: Negative for rash. Neurological: Numbness, tingling and weakness to bilateral lower extremities   ____________________________________________   PHYSICAL EXAM:  VITAL SIGNS: ED Triage Vitals  Enc Vitals Group     BP 06/08/17 2128 (!) 150/103     Pulse Rate 06/08/17 2128 (!) 132     Resp 06/08/17 2128 20     Temp 06/08/17 2128 (!) 100.5 F (38.1 C)     Temp Source 06/08/17 2128 Oral     SpO2 06/08/17 2128 96 %     Weight --      Height --      Head Circumference --      Peak Flow --      Pain Score 06/08/17 2132 10     Pain Loc --      Pain Edu? --      Excl. in GC? --     Constitutional: Alert and oriented. Ill appearing and in moderate distress. Eyes: Conjunctivae are normal. PERRL. EOMI. Head: Atraumatic. Nose: No congestion/rhinnorhea. Mouth/Throat: Mucous membranes are moist.  Oropharynx non-erythematous. Cardiovascular: Normal rate, regular rhythm. Grossly normal heart sounds.  Good peripheral circulation. Respiratory: Normal respiratory effort.  No retractions. Lungs CTAB. Gastrointestinal: Soft and nontender. No distention.  Positive bowel sounds Musculoskeletal: Lumbar spine pain to palpation positive straight leg raise.   Neurologic:  Normal speech and language.  Skin:  Skin is warm, dry and intact.  Psychiatric: Mood and affect are normal.   ____________________________________________   LABS (all labs ordered are listed, but only abnormal results are  displayed)  Labs Reviewed  URINALYSIS, COMPLETE (UACMP) WITH MICROSCOPIC - Abnormal; Notable for the following components:      Result Value   Color, Urine YELLOW (*)    APPearance CLEAR (*)    Hgb urine dipstick SMALL (*)    All other components within normal limits  COMPREHENSIVE METABOLIC PANEL - Abnormal; Notable for the following components:   Chloride 100 (*)    Glucose, Bld 126 (*)    All other components within normal limits  CBC WITH DIFFERENTIAL/PLATELET - Abnormal; Notable for the following components:   WBC 10.7 (*)    Neutro Abs 9.9 (*)    Lymphs Abs 0.3 (*)    All other components within normal limits  CULTURE, BLOOD (ROUTINE X 2)  CULTURE, BLOOD (ROUTINE X 2)  INFLUENZA PANEL BY PCR (  TYPE A & B)   ____________________________________________  EKG  None ____________________________________________  RADIOLOGY  ED MD interpretation: CT lumbar spine: L4-S1 posterior left fusion and interbody fusion without apparent hardware related complication, no acute fracture or malalignment, stable degenerative changes prior MRI given differences in technique.  Chest x-ray: No active cardiopulmonary disease  Official radiology report(s): Dg Chest 2 View  Result Date: 06/09/2017 CLINICAL DATA:  47 year old male with fever and chills. EXAM: CHEST - 2 VIEW COMPARISON:  Chest radiograph dated 03/29/2014 FINDINGS: Minimal bibasilar atelectatic changes. No focal consolidation, pleural effusion, or pneumothorax. The cardiac silhouette is within normal limits. Lower cervical fixation hardware. No acute osseous pathology. IMPRESSION: No active cardiopulmonary disease. Electronically Signed   By: Elgie Collard M.D.   On: 06/09/2017 01:22   Ct Lumbar Spine Wo Contrast  Result Date: 06/09/2017 CLINICAL DATA:  47 y/o M; generalized back pain, numbness in both legs starting last Monday, difficulty urinating, chills this afternoon. EXAM: CT LUMBAR SPINE WITHOUT CONTRAST TECHNIQUE:  Multidetector CT imaging of the lumbar spine was performed without intravenous contrast administration. Multiplanar CT image reconstructions were also generated. COMPARISON:  04/01/2017 lumbar spine MRI. FINDINGS: Segmentation: 5 lumbar type vertebrae. Alignment: Normal. Vertebrae: Left-sided L4-S1 posterior fusion with a vertical rods fixed by pedicle screws and L4-S1 interbody fusion with prostheses. No periprosthetic lucency or fracture is identified. No loss of vertebral body height. Paraspinal and other soft tissues: Punctate bilateral nonobstructing kidney stones. Disc levels: L3-4 disc bulge with mild foraminal stenosis. There are endplate marginal osteophytes combined with facet hypertrophy at the L4-5 and L5-S1 levels on the right with mild foraminal stenosis. Endplate marginal osteophytes and severe facet hypertrophy at the right L5-S1 level results in mild-to-moderate foraminal stenosis. Left L4-5 facetectomy. IMPRESSION: 1. L4-S1 left posterior fusion and interbody fusion without apparent hardware related complication. 2. No acute fracture or malalignment. 3. Stable degenerative changes prior MRI given differences in technique. Electronically Signed   By: Mitzi Hansen M.D.   On: 06/09/2017 00:18    ____________________________________________   PROCEDURES  Procedure(s) performed: None  Procedures  Critical Care performed: No  ____________________________________________   INITIAL IMPRESSION / ASSESSMENT AND PLAN / ED COURSE  As part of my medical decision making, I reviewed the following data within the electronic MEDICAL RECORD NUMBER Notes from prior ED visits and [place] Controlled Substance Database   This is a 47 year old male who comes into the hospital today with some back pain for the past week as well as fevers chills and shakes.  The patient knows that he has a bulging disc but my differential diagnosis includes urinary tract infection, influenza, pneumonia, discitis,  epidural abscess.  The patient had some blood work done to include a CBC, CMP and urinalysis which was negative.  The patient's doctor was scheduling a CT of his lumbar spine so I will perform that CT looking for possible discitis versus epidural abscess.  I will give the patient a liter of normal saline as well as some Toradol for his pain and fever.  I will also check a flu swab on the patient.  He will be reassessed  The patient's flu test is negative and his lumbar CT does not show any acute acute process.  I did go back to reassess the patient and he was sleeping comfortably.  His heart rate is improved.  I discussed the results with the family and did order some blood cultures.  The patient states that his pain is a 5 out of 10  and he is sleeping comfortably.  I will discharge the patient and encouraged him to follow-up with his primary care physician in the next 2 days.  The patient is to return with any worsening symptoms or any other concerns.  I did also encourage the patient to take his pain medication that he does have ordered for home.  He did receive a dose of Percocet here.  The patient will be discharged.      ____________________________________________   FINAL CLINICAL IMPRESSION(S) / ED DIAGNOSES  Final diagnoses:  Midline low back pain, unspecified chronicity, with sciatica presence unspecified  Lumbar radiculopathy  Fever, unspecified fever cause     ED Discharge Orders    None       Note:  This document was prepared using Dragon voice recognition software and may include unintentional dictation errors.    Rebecka Apley, MD 06/09/17 [phone]

## 2017-06-09 ENCOUNTER — Emergency Department: Payer: Commercial Managed Care - HMO

## 2017-06-09 LAB — INFLUENZA PANEL BY PCR (TYPE A & B)
INFLAPCR: NEGATIVE
INFLBPCR: NEGATIVE

## 2017-06-09 MED ORDER — OXYCODONE HCL 5 MG PO TABS
10.0000 mg | ORAL_TABLET | Freq: Once | ORAL | Status: AC
  Administered 2017-06-09: 10 mg via ORAL
  Filled 2017-06-09: qty 2

## 2017-06-09 NOTE — ED Notes (Signed)
Patient transported to X-ray at this time 

## 2017-06-09 NOTE — Discharge Instructions (Addendum)
Please follow-up with your primary care physician for further evaluation of your symptoms.  Please return with any worsening condition or any other concerns.

## 2017-06-11 ENCOUNTER — Encounter: Payer: 59 | Admitting: Family Medicine

## 2017-06-14 LAB — CULTURE, BLOOD (ROUTINE X 2)
CULTURE: NO GROWTH
Culture: NO GROWTH
Special Requests: ADEQUATE

## 2017-06-15 ENCOUNTER — Ambulatory Visit: Payer: 59

## 2017-06-25 ENCOUNTER — Other Ambulatory Visit: Payer: Self-pay | Admitting: Neurosurgery

## 2017-06-25 DIAGNOSIS — M5126 Other intervertebral disc displacement, lumbar region: Secondary | ICD-10-CM

## 2017-07-06 ENCOUNTER — Ambulatory Visit
Admission: RE | Admit: 2017-07-06 | Discharge: 2017-07-06 | Disposition: A | Discharge status: Home / Self Care | Payer: 59 | Source: Ambulatory Visit | Attending: Neurosurgery | Admitting: Neurosurgery

## 2017-07-06 DIAGNOSIS — M5126 Other intervertebral disc displacement, lumbar region: Secondary | ICD-10-CM

## 2017-07-06 MED ORDER — METHYLPREDNISOLONE ACETATE 40 MG/ML INJ SUSP (RADIOLOG
120.0000 mg | Freq: Once | INTRAMUSCULAR | Status: AC
  Administered 2017-07-06: 120 mg via EPIDURAL

## 2017-07-06 MED ORDER — IOPAMIDOL (ISOVUE-M 200) INJECTION 41%
1.0000 mL | Freq: Once | INTRAMUSCULAR | Status: AC
  Administered 2017-07-06: 1 mL via EPIDURAL

## 2017-07-06 NOTE — Discharge Instructions (Signed)

## 2017-08-20 ENCOUNTER — Encounter: Payer: 59 | Admitting: Family Medicine

## 2017-08-21 ENCOUNTER — Encounter: Payer: Self-pay | Admitting: Family Medicine

## 2017-09-18 ENCOUNTER — Encounter: Payer: Self-pay | Admitting: Unknown Physician Specialty

## 2017-10-09 ENCOUNTER — Ambulatory Visit: Payer: Self-pay

## 2017-10-09 ENCOUNTER — Ambulatory Visit: Payer: Self-pay | Admitting: Unknown Physician Specialty

## 2017-10-09 ENCOUNTER — Encounter: Payer: Self-pay | Admitting: Unknown Physician Specialty

## 2017-10-09 VITALS — BP 136/98 | HR 103 | Temp 98.7°F | Ht 68.5 in | Wt 218.2 lb

## 2017-10-09 DIAGNOSIS — M5126 Other intervertebral disc displacement, lumbar region: Secondary | ICD-10-CM

## 2017-10-09 DIAGNOSIS — Z024 Encounter for examination for driving license: Secondary | ICD-10-CM

## 2017-10-09 DIAGNOSIS — I10 Essential (primary) hypertension: Secondary | ICD-10-CM

## 2017-10-09 NOTE — Patient Instructions (Signed)
DASH Eating Plan DASH stands for "Dietary Approaches to Stop Hypertension." The DASH eating plan is a healthy eating plan that has been shown to reduce high blood pressure (hypertension). It may also reduce your risk for type 2 diabetes, heart disease, and stroke. The DASH eating plan may also help with weight loss. What are tips for following this plan? General guidelines  Avoid eating more than 2,300 mg (milligrams) of salt (sodium) a day. If you have hypertension, you may need to reduce your sodium intake to 1,500 mg a day.  Limit alcohol intake to no more than 1 drink a day for nonpregnant women and 2 drinks a day for men. One drink equals 12 oz of beer, 5 oz of wine, or 1 oz of hard liquor.  Work with your health care provider to maintain a healthy body weight or to lose weight. Ask what an ideal weight is for you.  Get at least 30 minutes of exercise that causes your heart to beat faster (aerobic exercise) most days of the week. Activities may include walking, swimming, or biking.  Work with your health care provider or diet and nutrition specialist (dietitian) to adjust your eating plan to your individual calorie needs. Reading food labels  Check food labels for the amount of sodium per serving. Choose foods with less than 5 percent of the Daily Value of sodium. Generally, foods with less than 300 mg of sodium per serving fit into this eating plan.  To find whole grains, look for the word "whole" as the first word in the ingredient list. Shopping  Buy products labeled as "low-sodium" or "no salt added."  Buy fresh foods. Avoid canned foods and premade or frozen meals. Cooking  Avoid adding salt when cooking. Use salt-free seasonings or herbs instead of table salt or sea salt. Check with your health care provider or pharmacist before using salt substitutes.  Do not fry foods. Cook foods using healthy methods such as baking, boiling, grilling, and broiling instead.  Cook with  heart-healthy oils, such as olive, canola, soybean, or sunflower oil. Meal planning   Eat a balanced diet that includes: ? 5 or more servings of fruits and vegetables each day. At each meal, try to fill half of your plate with fruits and vegetables. ? Up to 6-8 servings of whole grains each day. ? Less than 6 oz of lean meat, poultry, or fish each day. A 3-oz serving of meat is about the same size as a deck of cards. One egg equals 1 oz. ? 2 servings of low-fat dairy each day. ? A serving of nuts, seeds, or beans 5 times each week. ? Heart-healthy fats. Healthy fats called Omega-3 fatty acids are found in foods such as flaxseeds and coldwater fish, like sardines, salmon, and mackerel.  Limit how much you eat of the following: ? Canned or prepackaged foods. ? Food that is high in trans fat, such as fried foods. ? Food that is high in saturated fat, such as fatty meat. ? Sweets, desserts, sugary drinks, and other foods with added sugar. ? Full-fat dairy products.  Do not salt foods before eating.  Try to eat at least 2 vegetarian meals each week.  Eat more home-cooked food and less restaurant, buffet, and fast food.  When eating at a restaurant, ask that your food be prepared with less salt or no salt, if possible. What foods are recommended? The items listed may not be a complete list. Talk with your dietitian about what   dietary choices are best for you. Grains Whole-grain or whole-wheat bread. Whole-grain or whole-wheat pasta. Brown rice. Oatmeal. Quinoa. Bulgur. Whole-grain and low-sodium cereals. Pita bread. Low-fat, low-sodium crackers. Whole-wheat flour tortillas. Vegetables Fresh or frozen vegetables (raw, steamed, roasted, or grilled). Low-sodium or reduced-sodium tomato and vegetable juice. Low-sodium or reduced-sodium tomato sauce and tomato paste. Low-sodium or reduced-sodium canned vegetables. Fruits All fresh, dried, or frozen fruit. Canned fruit in natural juice (without  added sugar). Meat and other protein foods Skinless chicken or turkey. Ground chicken or turkey. Pork with fat trimmed off. Fish and seafood. Egg whites. Dried beans, peas, or lentils. Unsalted nuts, nut butters, and seeds. Unsalted canned beans. Lean cuts of beef with fat trimmed off. Low-sodium, lean deli meat. Dairy Low-fat (1%) or fat-free (skim) milk. Fat-free, low-fat, or reduced-fat cheeses. Nonfat, low-sodium ricotta or cottage cheese. Low-fat or nonfat yogurt. Low-fat, low-sodium cheese. Fats and oils Soft margarine without trans fats. Vegetable oil. Low-fat, reduced-fat, or light mayonnaise and salad dressings (reduced-sodium). Canola, safflower, olive, soybean, and sunflower oils. Avocado. Seasoning and other foods Herbs. Spices. Seasoning mixes without salt. Unsalted popcorn and pretzels. Fat-free sweets. What foods are not recommended? The items listed may not be a complete list. Talk with your dietitian about what dietary choices are best for you. Grains Baked goods made with fat, such as croissants, muffins, or some breads. Dry pasta or rice meal packs. Vegetables Creamed or fried vegetables. Vegetables in a cheese sauce. Regular canned vegetables (not low-sodium or reduced-sodium). Regular canned tomato sauce and paste (not low-sodium or reduced-sodium). Regular tomato and vegetable juice (not low-sodium or reduced-sodium). Pickles. Olives. Fruits Canned fruit in a light or heavy syrup. Fried fruit. Fruit in cream or butter sauce. Meat and other protein foods Fatty cuts of meat. Ribs. Fried meat. Bacon. Sausage. Bologna and other processed lunch meats. Salami. Fatback. Hotdogs. Bratwurst. Salted nuts and seeds. Canned beans with added salt. Canned or smoked fish. Whole eggs or egg yolks. Chicken or turkey with skin. Dairy Whole or 2% milk, cream, and half-and-half. Whole or full-fat cream cheese. Whole-fat or sweetened yogurt. Full-fat cheese. Nondairy creamers. Whipped toppings.  Processed cheese and cheese spreads. Fats and oils Butter. Stick margarine. Lard. Shortening. Ghee. Bacon fat. Tropical oils, such as coconut, palm kernel, or palm oil. Seasoning and other foods Salted popcorn and pretzels. Onion salt, garlic salt, seasoned salt, table salt, and sea salt. Worcestershire sauce. Tartar sauce. Barbecue sauce. Teriyaki sauce. Soy sauce, including reduced-sodium. Steak sauce. Canned and packaged gravies. Fish sauce. Oyster sauce. Cocktail sauce. Horseradish that you find on the shelf. Ketchup. Mustard. Meat flavorings and tenderizers. Bouillon cubes. Hot sauce and Tabasco sauce. Premade or packaged marinades. Premade or packaged taco seasonings. Relishes. Regular salad dressings. Where to find more information:  National Heart, Lung, and Blood Institute: www.nhlbi.nih.gov  American Heart Association: www.heart.org Summary  The DASH eating plan is a healthy eating plan that has been shown to reduce high blood pressure (hypertension). It may also reduce your risk for type 2 diabetes, heart disease, and stroke.  With the DASH eating plan, you should limit salt (sodium) intake to 2,300 mg a day. If you have hypertension, you may need to reduce your sodium intake to 1,500 mg a day.  When on the DASH eating plan, aim to eat more fresh fruits and vegetables, whole grains, lean proteins, low-fat dairy, and heart-healthy fats.  Work with your health care provider or diet and nutrition specialist (dietitian) to adjust your eating plan to your individual   calorie needs. This information is not intended to replace advice given to you by your health care provider. Make sure you discuss any questions you have with your health care provider. Document Released: 01/09/2011 Document Revised: 01/14/2016 Document Reviewed: 01/14/2016 Elsevier Interactive Patient Education  2018 Elsevier Inc.  

## 2017-10-09 NOTE — Progress Notes (Signed)
BP (!) 136/98 (BP Location: Left Arm, Cuff Size: Large)   Pulse (!) 103   Temp 98.7 F (37.1 C) (Oral)   Ht 5' 8.5" (1.74 m)   Wt 218 lb 3.2 oz (99 kg)   SpO2 97%   BMI 32.69 kg/m    Subjective:    Patient ID: [name], male    DOB: [birth year], 47 y.o.   MRN: [id number]  HPI: [name] is a 47 y.o. male  Chief Complaint  Patient presents with  . DOT Physical   Pt is "fixing to have back surgery on September 26" due to a ruptured disk.  He has had previous back surgeries x 4.  Pt is taking prn Hydrocodone 5/325mg  on occasion, last was 3 weeks ago.  Takes Cyclobenzeprine only in the evening.  Pill counts confirm the above numbers.  Currently no driving.  Is aware that taking muscle relaxants and narcotic prescriptions and driving is contraindicated.    Relevant past medical, surgical, family and social history reviewed and updated as indicated. Interim medical history since our last visit reviewed. Allergies and medications reviewed and updated.  Review of Systems  Per HPI unless specifically indicated above     Objective:    BP (!) 136/98 (BP Location: Left Arm, Cuff Size: Large)   Pulse (!) 103   Temp 98.7 F (37.1 C) (Oral)   Ht 5' 8.5" (1.74 m)   Wt 218 lb 3.2 oz (99 kg)   SpO2 97%   BMI 32.69 kg/m   Wt Readings from Last 3 Encounters:  10/09/17 218 lb 3.2 oz (99 kg)  07/21/16 234 lb (106.1 kg)  02/15/16 228 lb 9.6 oz (103.7 kg)    Physical Exam   See form  Results for orders placed or performed during the hospital encounter of 06/08/17  Blood culture (routine x 2)  Result Value Ref Range   Specimen Description BLOOD RIGHT AC    Special Requests      BOTTLES DRAWN AEROBIC AND ANAEROBIC Blood Culture adequate volume   Culture      NO GROWTH 5 DAYS Performed at [facility], [address] [place], [place] [address]    Report Status 06/14/2017 FINAL   Blood culture (routine x 2)  Result Value Ref Range   Specimen  Description BLOOD LEFT AC    Special Requests      BOTTLES DRAWN AEROBIC AND ANAEROBIC Blood Culture results may not be optimal due to an excessive volume of blood received in culture bottles   Culture      NO GROWTH 5 DAYS Performed at [facility], [address] [place], [place] [address]    Report Status 06/14/2017 FINAL   Urinalysis, Complete w Microscopic  Result Value Ref Range   Color, Urine YELLOW (A) YELLOW   APPearance CLEAR (A) CLEAR   Specific Gravity, Urine 1.020 1.005 - 1.030   pH 5.0 5.0 - 8.0   Glucose, UA NEGATIVE NEGATIVE mg/dL   Hgb urine dipstick SMALL (A) NEGATIVE   Bilirubin Urine NEGATIVE NEGATIVE   Ketones, ur NEGATIVE NEGATIVE mg/dL   Protein, ur NEGATIVE NEGATIVE mg/dL   Nitrite NEGATIVE NEGATIVE   Leukocytes, UA NEGATIVE NEGATIVE   RBC / HPF 0-5 0 - 5 RBC/hpf   WBC, UA 0-5 0 - 5 WBC/hpf   Bacteria, UA NONE SEEN NONE SEEN   Squamous Epithelial / LPF 0-5 0 - 5   Mucus PRESENT   Comprehensive metabolic panel  Result Value Ref Range   Sodium 136 135 - 145 mmol/L   Potassium 4.3 3.5 - 5.1 mmol/L   Chloride 100 (L) 101 - 111 mmol/L   CO2 26 22 - 32 mmol/L   Glucose, Bld 126 (H) 65 - 99 mg/dL   BUN 16 6 - 20 mg/dL   Creatinine, Ser 7.54 0.61 - 1.24 mg/dL   Calcium 8.9 8.9 - 49.2 mg/dL   Total Protein 7.6 6.5 - 8.1 g/dL   Albumin 4.3 3.5 - 5.0 g/dL   AST 27 15 - 41 U/L   ALT 36 17 - 63 U/L   Alkaline Phosphatase 84 38 - 126 U/L   Total Bilirubin 1.1 0.3 - 1.2 mg/dL   GFR calc non Af Amer >60 >60 mL/min   GFR calc Af Amer >60 >60 mL/min   Anion gap 10 5 - 15  CBC with Differential  Result Value Ref Range   WBC 10.7 (H) 3.8 - 10.6 K/uL   RBC 5.26 4.40 - 5.90 MIL/uL   Hemoglobin 16.7 13.0 - 18.0 g/dL   HCT 01.0 07.1 - 21.9 %   MCV 93.5 80.0 - 100.0 fL   MCH 31.7 26.0 - 34.0 pg   MCHC 33.9 32.0 - 36.0 g/dL   RDW 75.8 83.2 - 54.9 %   Platelets 265 150 - 440 K/uL   Neutrophils Relative % 93 %   Neutro Abs 9.9 (H) 1.4 - 6.5 K/uL    Lymphocytes Relative 3 %   Lymphs Abs 0.3 (L) 1.0 - 3.6 K/uL   Monocytes Relative 4 %   Monocytes Absolute 0.4 0.2 - 1.0 K/uL   Eosinophils Relative 0 %   Eosinophils Absolute 0.0 0 - 0.7 K/uL   Basophils Relative 0 %   Basophils Absolute 0.0 0 - 0.1 K/uL  Influenza panel by PCR (type A & B)  Result Value Ref Range   Influenza A By PCR NEGATIVE NEGATIVE   Influenza B By PCR NEGATIVE NEGATIVE      Assessment & Plan:   Problem List Items Addressed This Visit      Unprioritized   Hypertension    Borderline.  Discussed goal of keeping BP below 130/80.  DASH.  OK for 1 year certificate      Ruptured lumbar disc    Other Visit Diagnoses    Encounter for commercial driver medical examination (CDME)    -  Primary        Follow up plan: Return in about 6 months (around 04/09/2018) for physical.

## 2017-10-09 NOTE — Assessment & Plan Note (Signed)
Borderline.  Discussed goal of keeping BP below 130/80.  DASH.  OK for 1 year certificate

## 2017-11-13 ENCOUNTER — Other Ambulatory Visit: Payer: Self-pay

## 2017-11-13 ENCOUNTER — Emergency Department: Payer: BLUE CROSS/BLUE SHIELD

## 2017-11-13 ENCOUNTER — Emergency Department
Admission: EM | Admit: 2017-11-13 | Discharge: 2017-11-13 | Disposition: A | Discharge status: Home / Self Care | Payer: BLUE CROSS/BLUE SHIELD | Attending: Emergency Medicine | Admitting: Emergency Medicine

## 2017-11-13 ENCOUNTER — Encounter: Payer: Self-pay | Admitting: Emergency Medicine

## 2017-11-13 DIAGNOSIS — Z91040 Latex allergy status: Secondary | ICD-10-CM | POA: Insufficient documentation

## 2017-11-13 DIAGNOSIS — I10 Essential (primary) hypertension: Secondary | ICD-10-CM | POA: Insufficient documentation

## 2017-11-13 DIAGNOSIS — M5416 Radiculopathy, lumbar region: Secondary | ICD-10-CM

## 2017-11-13 DIAGNOSIS — G9782 Other postprocedural complications and disorders of nervous system: Secondary | ICD-10-CM | POA: Insufficient documentation

## 2017-11-13 DIAGNOSIS — M79652 Pain in left thigh: Secondary | ICD-10-CM | POA: Diagnosis present

## 2017-11-13 LAB — CBC WITH DIFFERENTIAL/PLATELET
Abs Immature Granulocytes: 0.07 10*3/uL (ref 0.00–0.07)
BASOS PCT: 1 %
Basophils Absolute: 0 10*3/uL (ref 0.0–0.1)
Eosinophils Absolute: 0.2 10*3/uL (ref 0.0–0.5)
Eosinophils Relative: 2 %
HCT: 44.5 % (ref 39.0–52.0)
Hemoglobin: 14.6 g/dL (ref 13.0–17.0)
IMMATURE GRANULOCYTES: 1 %
Lymphocytes Relative: 37 %
Lymphs Abs: 2.3 10*3/uL (ref 0.7–4.0)
MCH: 31.3 pg (ref 26.0–34.0)
MCHC: 32.8 g/dL (ref 30.0–36.0)
MCV: 95.3 fL (ref 80.0–100.0)
Monocytes Absolute: 0.5 10*3/uL (ref 0.1–1.0)
Monocytes Relative: 8 %
NEUTROS PCT: 51 %
Neutro Abs: 3.2 10*3/uL (ref 1.7–7.7)
PLATELETS: 392 10*3/uL (ref 150–400)
RBC: 4.67 MIL/uL (ref 4.22–5.81)
RDW: 11.9 % (ref 11.5–15.5)
WBC: 6.2 10*3/uL (ref 4.0–10.5)
nRBC: 0 % (ref 0.0–0.2)

## 2017-11-13 LAB — COMPREHENSIVE METABOLIC PANEL
ALBUMIN: 4 g/dL (ref 3.5–5.0)
ALT: 27 U/L (ref 0–44)
ANION GAP: 7 (ref 5–15)
AST: 25 U/L (ref 15–41)
Alkaline Phosphatase: 79 U/L (ref 38–126)
BILIRUBIN TOTAL: 0.5 mg/dL (ref 0.3–1.2)
BUN: 16 mg/dL (ref 6–20)
CO2: 27 mmol/L (ref 22–32)
Calcium: 8.9 mg/dL (ref 8.9–10.3)
Chloride: 105 mmol/L (ref 98–111)
Creatinine, Ser: 0.93 mg/dL (ref 0.61–1.24)
GFR calc Af Amer: >60 mL/min (ref >60)
Glucose, Bld: 111 mg/dL — ABNORMAL HIGH (ref 70–99)
POTASSIUM: 4.1 mmol/L (ref 3.5–5.1)
Sodium: 139 mmol/L (ref 135–145)
TOTAL PROTEIN: 6.9 g/dL (ref 6.5–8.1)

## 2017-11-13 MED ORDER — MORPHINE SULFATE (PF) 4 MG/ML IV SOLN
8.0000 mg | Freq: Once | INTRAVENOUS | Status: AC
  Administered 2017-11-13: 8 mg via INTRAVENOUS
  Filled 2017-11-13: qty 2

## 2017-11-13 MED ORDER — HALOPERIDOL LACTATE 5 MG/ML IJ SOLN
2.5000 mg | Freq: Once | INTRAMUSCULAR | Status: AC
  Administered 2017-11-13: 2.5 mg via INTRAVENOUS
  Filled 2017-11-13: qty 1

## 2017-11-13 MED ORDER — GABAPENTIN 300 MG PO CAPS: 300.0000 mg | ORAL_CAPSULE | Freq: Three times a day (TID) | ORAL | 0 refills | Status: AC

## 2017-11-13 MED ORDER — GADOBUTROL 1 MMOL/ML IV SOLN
10.0000 mL | Freq: Once | INTRAVENOUS | Status: AC | PRN
  Administered 2017-11-13: 10 mL via INTRAVENOUS

## 2017-11-13 NOTE — ED Provider Notes (Signed)
[facility] Emergency Department Provider Note  ____________________________________________   First MD Initiated Contact with Patient 11/13/17 [phone]     (approximate)  I have reviewed the triage vital signs and the nursing notes.   HISTORY  Chief Complaint Leg Pain   HPI [name] is a 47 y.o. male who self presents to the emergency department with moderate to severe left medial thigh and left calf pain along with difficulty ambulating.  2 weeks ago the patient had a laminectomy and fusion at L3-L4-L5 at [facility] with Dr. Channing Mutters.  He initially did well postoperatively and was able to ambulate and has been wearing his brace however for the past 3 days his numbness has progressed and become severe pain.  He came to the emergency department this morning because he was unable to sleep and he has noted weakness and difficulty ambulating.  He denies fevers or chills.  He has been taking Percocet at home with no relief.  He feels that his skin is exquisitely sensitive and even light touch causes him exquisite discomfort.  No urinary or fecal hesitance or incontinence.  His symptoms came on gradually are severe and nothing seems to make them better or worse.    Past Medical History:  Diagnosis Date  . DDD (degenerative disc disease), lumbar   . Dental bridge present    permanant - top left  . Headache    migraines - pinched nerve in neck - no mvmt limitations  . Hernia July 2014   umbilical  . Hypertension     Patient Active Problem List   Diagnosis Date Noted  . Ruptured lumbar disc 10/09/2017  . Rhinitis medicamentosa 07/06/2015  . DDD (degenerative disc disease), lumbar 07/20/2014  . Hyperlipidemia 07/20/2014  . Hypertension 07/20/2014  . DDD (degenerative disc disease), cervical 07/20/2014  . Umbilical hernia 08/31/2012    Past Surgical History:  Procedure Laterality Date  . BACK SURGERY  2003, 2007, 2009, 2012   Dr Trey Sailors  . HERNIA REPAIR   2014   umbilical hernia  . KNEE SURGERY Right 1997, 2001   torn miniscus  . LUMBAR DISC SURGERY     X 3 with hardware  . NASAL TURBINATE REDUCTION Bilateral 10/26/2015   Procedure: TURBINATE REDUCTION/SUBMUCOSAL RESECTION;  Surgeon: Linus Salmons, MD;  Location: [facility];  Service: ENT;  Laterality: Bilateral;  . SEPTOPLASTY Bilateral 10/26/2015   Procedure: SEPTOPLASTY;  Surgeon: Linus Salmons, MD;  Location: [facility];  Service: ENT;  Laterality: Bilateral;    Prior to Admission medications   Medication Sig Start Date End Date Taking? Authorizing Provider  cyclobenzaprine (FLEXERIL) 10 MG tablet Take 10 mg by mouth 3 (three) times daily as needed for muscle spasms.    [provider]  HYDROcodone-acetaminophen (NORCO/VICODIN) 5-325 MG tablet Take 1 tablet by mouth every 8 (eight) hours as needed for moderate pain.    [provider]    Allergies Other; Amlodipine; Celebrex [celecoxib]; and Latex  Family History  Problem Relation Age of Onset  . Heart Problems Father   . Cancer Mother        pancreatic  . Heart Problems Maternal Grandfather   . Heart Problems Paternal Grandfather     Social History Social History   Tobacco Use  . Smoking status: Never Smoker  . Smokeless tobacco: Never Used  Substance Use Topics  . Alcohol use: No  . Drug use: No    Review of Systems Constitutional: No fever/chills Eyes:  No visual changes. ENT: No sore throat. Cardiovascular: Denies chest pain. Respiratory: Denies shortness of breath. Gastrointestinal: No abdominal pain.  No nausea, no vomiting.  No diarrhea.  No constipation. Genitourinary: Negative for dysuria. Musculoskeletal: Positive for back pain. Skin: Negative for rash. Neurological: Positive for focal numbness and weakness   ____________________________________________   PHYSICAL EXAM:  VITAL SIGNS: ED Triage Vitals [11/13/17 0611]  Enc Vitals Group     BP      Pulse       Resp      Temp      Temp src      SpO2      Weight 218 lb 4.1 oz (99 kg)     Height      Head Circumference      Peak Flow      Pain Score      Pain Loc      Pain Edu?      Excl. in GC?     Constitutional: Alert and oriented x4 appears exquisitely uncomfortable moving very gingerly Eyes: PERRL EOMI. Head: Atraumatic. Nose: No congestion/rhinnorhea. Mouth/Throat: No trismus Neck: No stridor.   Cardiovascular: Normal rate, regular rhythm. Grossly normal heart sounds.  Good peripheral circulation. Respiratory: Normal respiratory effort.  No retractions. Lungs CTAB and moving good air Gastrointestinal: Soft nontender Musculoskeletal: No lower extremity edema   Neurologic: Hyperalgesia in L3 and L4 distribution on the left with decreased sensation Unable to fully extend his knee and unable to fully flex his hip.  Decreased strength with plantar flexion and dorsiflexion 2+ DTRs no ankle clonus Skin:  Skin is warm, dry and intact. No rash noted. Psychiatric: Mood and affect are normal. Speech and behavior are normal.    ____________________________________________   DIFFERENTIAL includes but not limited to  Postoperative hematoma, postoperative infection, nerve impingement ____________________________________________   LABS (all labs ordered are listed, but only abnormal results are displayed)  Labs Reviewed  COMPREHENSIVE METABOLIC PANEL - Abnormal; Notable for the following components:      Result Value   Glucose, Bld 111 (*)    All other components within normal limits  CBC WITH DIFFERENTIAL/PLATELET    Lab work reviewed by me with no clear etiology of the patient's symptoms identified __________________________________________  EKG   ____________________________________________  RADIOLOGY  MRI of the lumbar spine with and without contrast is pending ____________________________________________   PROCEDURES  Procedure(s) performed:  no  Procedures  Critical Care performed: no  ____________________________________________   INITIAL IMPRESSION / ASSESSMENT AND PLAN / ED COURSE  Pertinent labs & imaging results that were available during my care of the patient were reviewed by me and considered in my medical decision making (see chart for details).   As part of my medical decision making, I reviewed the following data within the electronic MEDICAL RECORD NUMBER History obtained from family if available, nursing notes, old chart and ekg, as well as notes from prior ED visits.  The patient comes to the emergency department extremely uncomfortable appearing with hyperalgesia in his left medial thigh down to his left calf in an L3 and L4 distribution along with decreased strength.  He said he initially did well postoperatively and was ambulating without difficulty however for the past 3 or 4 days his symptoms have been rapidly worsening.  His neurosurgeon is Dr. Trey Sailors at [facility] and I have a call out to him now.  I will give him 8 mg of IV morphine and 2.5 mg of IV Haldol for  pain and nausea.  I discussed the case with on-call neuroradiologist Dr. Chase Picket who recommended that the MRI be with and without contrast.     ----------------------------------------- 7:51 AM on 11/13/2017 -----------------------------------------  We have been unable to get in touch with the patient's neurosurgeon at [facility] Dr. Channing Mutters.  I then spoke with [facility] transfer center who got in touch with on-call neurosurgeon Dr. Ephriam Jenkins who is recommending ER to ER transfer for evaluation as he is not familiar with the patient.  I have a call out to the [facility] ER attending physician however they have not call back at this point and care is transitioned to Dr. Derrill Kay who will finalize the transfer.  The patient's pain is currently adequately controlled. _  FINAL CLINICAL IMPRESSION(S) / ED DIAGNOSES  Final diagnoses:  Lumbar radiculopathy  Postoperative surgical  complication involving nervous system associated with nervous system procedure, unspecified complication      NEW MEDICATIONS STARTED DURING THIS VISIT:  New Prescriptions   No medications on file     Note:  This document was prepared using Dragon voice recognition software and may include unintentional dictation errors.     Merrily Brittle, MD 11/13/17 [phone]

## 2017-11-13 NOTE — Discharge Instructions (Addendum)
Please seek medical attention for any high fevers, chest pain, shortness of breath, change in behavior, persistent vomiting, bloody stool or any other new or concerning symptoms.  

## 2017-11-13 NOTE — ED Provider Notes (Signed)
MRI showed postoperative changes however no concerning hematoma or abscess.  I was able to get in touch with Dr. Channing Mutters the neurosurgeon who performed the operation.  At this point given MRI findings he felt comfortable with patient being discharged home.  Did recommend adding on gabapentin to the patient's pain regimen.  Discussed this plan with the patient.  Plan on discharging with gabapentin.   Phineas Semen, MD 11/13/17 1044

## 2017-11-13 NOTE — ED Triage Notes (Signed)
Patient to ER for c/o left leg pain and swelling. Patient reports having surgery two weeks ago Thursday. Patient arrives with brace to back in place.

## 2017-11-13 NOTE — ED Notes (Signed)
Patient transported to MRI 

## 2017-12-22 ENCOUNTER — Encounter: Payer: Self-pay | Admitting: Family Medicine

## 2017-12-22 ENCOUNTER — Ambulatory Visit: Payer: BLUE CROSS/BLUE SHIELD | Admitting: Family Medicine

## 2017-12-22 ENCOUNTER — Other Ambulatory Visit: Payer: Self-pay

## 2017-12-22 VITALS — BP 126/86 | HR 92 | Temp 98.5°F | Ht 70.0 in | Wt 230.0 lb

## 2017-12-22 DIAGNOSIS — R6889 Other general symptoms and signs: Secondary | ICD-10-CM

## 2017-12-22 DIAGNOSIS — R0989 Other specified symptoms and signs involving the circulatory and respiratory systems: Secondary | ICD-10-CM

## 2017-12-22 DIAGNOSIS — F418 Other specified anxiety disorders: Secondary | ICD-10-CM

## 2017-12-22 DIAGNOSIS — J029 Acute pharyngitis, unspecified: Secondary | ICD-10-CM

## 2017-12-22 DIAGNOSIS — R0982 Postnasal drip: Secondary | ICD-10-CM | POA: Diagnosis not present

## 2017-12-22 MED ORDER — TRIAMCINOLONE ACETONIDE 55 MCG/ACT NA AERO: 2.0000 | INHALATION_SPRAY | Freq: Every day | NASAL | 12 refills | Status: AC

## 2017-12-22 NOTE — Progress Notes (Signed)
BP 126/86   Pulse 92   Temp 98.5 F (36.9 C) (Oral)   Ht 5\' 10"  (1.778 m)   Wt 230 lb (104.3 kg)   SpO2 95%   BMI 33.00 kg/m    Subjective:    Patient ID: ADALID BECKMANN, male    DOB: Mar 18, 1970, 47 y.o.   MRN: [id number]  HPI: CACE OSORTO is a 47 y.o. male  Chief Complaint  Patient presents with  . Sore Throat    pt states he has had swollen throat on and off for about a couple years ago/ states swelling and discomfort got worse after back surgery 6 weeks ago  . Anxiety   Had back surgery about 6 weeks ago, when he had the ET tube pulled out, it irritated his throat right to begin with. He states that he had 2 episodes in the hospital where he felt like his throat was closing up. He was treated with chloroseptic at that time and it helped. He was fine for 5 weeks, then this week he has had 2 episodes where he felt like his throat was closing up. He doesn't make any stridorous noises. He denies any pain. He has not been feeling sick. He notes that it feels tight in his throat when he is laying down. It will last for 5 minutes to an hour. Using throat spray tends to help. Also took benadryl which also tended to help. He does not know when or why this happens. He does note that he was laying down both times that it happened. He notes that this made him very anxious as he was afraid that he was going to not be able to breathe. He notes that his anxiety and stress levels have been stable recently. He has been sleeping well. He has otherwise been feeling well. He has not had any fevers. Not feeling sick. He does note that he has had some swollen glands in his neck- this was noted by his dentist a few weeks ago. No fatigue. No weight loss. He has otherwise been feeling well.  Depression screen PHQ 2/9 12/22/2017  Decreased Interest 0  Down, Depressed, Hopeless 0  PHQ - 2 Score 0  Altered sleeping 3  Tired, decreased energy 0  Change in appetite 0  Feeling bad or failure about  yourself  0  Trouble concentrating 3  Moving slowly or fidgety/restless 0  Suicidal thoughts 0  PHQ-9 Score 6   GAD 7 : Generalized Anxiety Score 12/22/2017  Nervous, Anxious, on Edge 0  Control/stop worrying 0  Worry too much - different things 0  Trouble relaxing 3  Restless 2  Easily annoyed or irritable 1  Afraid - awful might happen 0  Total GAD 7 Score 6    Relevant past medical, surgical, family and social history reviewed and updated as indicated. Interim medical history since our last visit reviewed. Allergies and medications reviewed and updated.  Review of Systems  Constitutional: Negative.   HENT: Positive for postnasal drip and sore throat. Negative for congestion, dental problem, drooling, ear discharge, ear pain, facial swelling, hearing loss, mouth sores, nosebleeds, rhinorrhea, sinus pressure, sinus pain, sneezing, tinnitus, trouble swallowing and voice change.   Eyes: Negative.   Respiratory: Negative.  Negative for apnea, cough, choking, chest tightness, shortness of breath, wheezing and stridor.   Cardiovascular: Negative.  Negative for chest pain, palpitations and leg swelling.  Gastrointestinal: Negative.   Psychiatric/Behavioral: Negative.     Per HPI unless specifically  indicated above     Objective:    BP 126/86   Pulse 92   Temp 98.5 F (36.9 C) (Oral)   Ht 5\' 10"  (1.778 m)   Wt 230 lb (104.3 kg)   SpO2 95%   BMI 33.00 kg/m   Wt Readings from Last 3 Encounters:  12/22/17 230 lb (104.3 kg)  11/13/17 222 lb (100.7 kg)  10/09/17 218 lb 3.2 oz (99 kg)    Physical Exam  Constitutional: He is oriented to person, place, and time. He appears well-developed and well-nourished. No distress.  HENT:  Head: Normocephalic and atraumatic.  Right Ear: Hearing, tympanic membrane and ear canal normal. No drainage, swelling or tenderness. No middle ear effusion.  Left Ear: Hearing, tympanic membrane and ear canal normal. No drainage, swelling or  tenderness.  No middle ear effusion.  Nose: Nose normal.  Mouth/Throat: Uvula is midline, oropharynx is clear and moist and mucous membranes are normal. Mucous membranes are not pale, not dry and not cyanotic. No oral lesions. No uvula swelling. No oropharyngeal exudate, posterior oropharyngeal edema, posterior oropharyngeal erythema or tonsillar abscesses. No tonsillar exudate.  + post nasal drip with cobblestoning at the back of his throat  Eyes: Pupils are equal, round, and reactive to light. Conjunctivae, EOM and lids are normal. Right eye exhibits no discharge. Left eye exhibits no discharge. No scleral icterus.  Neck: Normal range of motion. Neck supple. No thyromegaly present.  Cardiovascular: Normal rate, regular rhythm, normal heart sounds and intact distal pulses. Exam reveals no gallop and no friction rub.  No murmur heard. Pulmonary/Chest: Effort normal and breath sounds normal. No stridor. No respiratory distress. He has no wheezes. He has no rhonchi. He has no rales. He exhibits no tenderness.  Musculoskeletal: Normal range of motion.  Neurological: He is alert and oriented to person, place, and time.  Skin: Skin is warm, dry and intact. Capillary refill takes less than 2 seconds. No rash noted. He is not diaphoretic. No erythema. No pallor.  Psychiatric: He has a normal mood and affect. His speech is normal and behavior is normal. Judgment and thought content normal. Cognition and memory are normal.  Nursing note and vitals reviewed.   Results for orders placed or performed during the hospital encounter of 11/13/17  Comprehensive metabolic panel  Result Value Ref Range   Sodium 139 135 - 145 mmol/L   Potassium 4.1 3.5 - 5.1 mmol/L   Chloride 105 98 - 111 mmol/L   CO2 27 22 - 32 mmol/L   Glucose, Bld 111 (H) 70 - 99 mg/dL   BUN 16 6 - 20 mg/dL   Creatinine, Ser 0.45 0.61 - 1.24 mg/dL   Calcium 8.9 8.9 - 40.9 mg/dL   Total Protein 6.9 6.5 - 8.1 g/dL   Albumin 4.0 3.5 - 5.0  g/dL   AST 25 15 - 41 U/L   ALT 27 0 - 44 U/L   Alkaline Phosphatase 79 38 - 126 U/L   Total Bilirubin 0.5 0.3 - 1.2 mg/dL   GFR calc non Af Amer >60 >60 mL/min   GFR calc Af Amer >60 >60 mL/min   Anion gap 7 5 - 15  CBC with Differential  Result Value Ref Range   WBC 6.2 4.0 - 10.5 K/uL   RBC 4.67 4.22 - 5.81 MIL/uL   Hemoglobin 14.6 13.0 - 17.0 g/dL   HCT 81.1 91.4 - 78.2 %   MCV 95.3 80.0 - 100.0 fL   MCH 31.3 26.0 -  34.0 pg   MCHC 32.8 30.0 - 36.0 g/dL   RDW 16.111.9 09.611.5 - 04.515.5 %   Platelets 392 150 - 400 K/uL   nRBC 0.0 0.0 - 0.2 %   Neutrophils Relative % 51 %   Neutro Abs 3.2 1.7 - 7.7 K/uL   Lymphocytes Relative 37 %   Lymphs Abs 2.3 0.7 - 4.0 K/uL   Monocytes Relative 8 %   Monocytes Absolute 0.5 0.1 - 1.0 K/uL   Eosinophils Relative 2 %   Eosinophils Absolute 0.2 0.0 - 0.5 K/uL   Basophils Relative 1 %   Basophils Absolute 0.0 0.0 - 0.1 K/uL   Immature Granulocytes 1 %   Abs Immature Granulocytes 0.07 0.00 - 0.07 K/uL      Assessment & Plan:   Problem List Items Addressed This Visit    None    Visit Diagnoses    Throat tightness    -  Primary   Likely due to PND. Offered burst of prednisone, which patient declined. Will treat with nasocort- if not getting better will need imaging/follow up with ENT   Post-nasal drip       Offered prednisone, which patient declined. Will restart his nasocort- originally Rx'd by Dr. Jenne CampusMcQueen. Call if not getting better or getting worse.    Sore throat       Strep negative.    Relevant Orders   Rapid Strep Screen ([facility] ONLY)   Situational anxiety       Seems to be due to his PND- will treat, if not getting better, let us know.        Follow up plan: Return if symptoms worsen or fail to improve.

## 2017-12-25 LAB — RAPID STREP SCREEN (MED CTR MEBANE ONLY): Strep Gp A Ag, IA W/Reflex: NEGATIVE

## 2017-12-25 LAB — CULTURE, GROUP A STREP: STREP A CULTURE: NEGATIVE

## 2018-06-10 ENCOUNTER — Ambulatory Visit (INDEPENDENT_AMBULATORY_CARE_PROVIDER_SITE_OTHER): Payer: BLUE CROSS/BLUE SHIELD | Admitting: Family Medicine

## 2018-06-10 ENCOUNTER — Other Ambulatory Visit: Payer: Self-pay

## 2018-06-10 ENCOUNTER — Encounter: Payer: Self-pay | Admitting: Family Medicine

## 2018-06-10 DIAGNOSIS — M961 Postlaminectomy syndrome, not elsewhere classified: Secondary | ICD-10-CM | POA: Diagnosis not present

## 2018-06-10 NOTE — Assessment & Plan Note (Signed)
Patient with multiple back surgeries chronic pain with disability from both cervical and lumbar regions. Patient with inadequate musculoskeletal testing resulting in canceling his disability insurance.  Patient will need a full back to work exam to assess his ability to go back to work not just the brief statements in Dr. Temple Pacini notes.  Chronic pain will refer to pain management for medication therapy.  I made it clear to the patient that  No narcotics will be prescribed from this office.  Attention to the pain clinic: even with recommendations for narcotics from the pain management clinic we will not be prescribing narcotics!

## 2018-06-10 NOTE — Progress Notes (Signed)
There were no vitals taken for this visit.   Subjective:    Patient ID: [name], male    DOB: January 05, 1971, 48 y.o.   MRN: [id number]  HPI: [name] is a 48 y.o. male  F/u med check  Telemedicine using audio/video telecommunications for a synchronous communication visit. Today's visit due to COVID-19 isolation precautions I connected with and verified that I am speaking with the correct person using two identifiers.   I discussed the limitations, risks, security and privacy concerns of performing an evaluation and management service by telecommunication and the availability of in person appointments. I also discussed with the patient that there may be a patient responsible charge related to this service. The patient expressed understanding and agreed to proceed. The patient's location is home. I am at home.  Patient with multiple concerns regarding his back and neck. History of these concerns develops with his neurosurgeon Dr. Channing Mutters retiring and not prescribing any further medications. The neurosurgeons the patient has been referred to will not prescribe medications and the patient takes narcotics from time to time.  I explained the limitations our office is under also and that   No narcotics will be prescribed from this office.  Patient will be referred to a pain clinic for pain management.  Patient's insurance requires [employer]. Patient also concerned as his disability insurance has been canceled due to notes from Dr. Temple Pacini office  Copied and pasted here: Examination: General Exam: To confrontational testing his strength is full.  I discussed with patient that this is clearly inadequate to assess whether the patient can go back to work and can withstand the rigors of full work with no disability.  Relevant past medical, surgical, family and social history reviewed and updated as indicated. Interim medical history since our last visit  reviewed. Allergies and medications reviewed and updated.  Review of Systems  Constitutional: Negative.   Respiratory: Negative.   Cardiovascular: Negative.     Per HPI unless specifically indicated above     Objective:    There were no vitals taken for this visit.  Wt Readings from Last 3 Encounters:  12/22/17 230 lb (104.3 kg)  11/13/17 222 lb (100.7 kg)  10/09/17 218 lb 3.2 oz (99 kg)    Physical Exam  Results for orders placed or performed in visit on 12/22/17  Rapid Strep Screen ([facility] ONLY)  Result Value Ref Range   Strep Gp A Ag, IA W/Reflex Negative Negative  Culture, Group A Strep  Result Value Ref Range   Strep A Culture Negative       Assessment & Plan:   Problem List Items Addressed This Visit      Other   Failed back surgical syndrome    Patient with multiple back surgeries chronic pain with disability from both cervical and lumbar regions. Patient with inadequate musculoskeletal testing resulting in canceling his disability insurance.  Patient will need a full back to work exam to assess his ability to go back to work not just the brief statements in Dr. Temple Pacini notes.  Chronic pain will refer to pain management for medication therapy.  I made it clear to the patient that  No narcotics will be prescribed from this office.  Attention to the pain clinic: even with recommendations for narcotics from the pain management clinic we will not be prescribing narcotics!         Other Visit Diagnoses    Failed back syndrome of  lumbar spine    -  Primary   Relevant Orders   Ambulatory referral to Pain Clinic       I discussed the assessment and treatment plan with the patient. The patient was provided an opportunity to ask questions and all were answered. The patient agreed with the plan and demonstrated an understanding of the instructions.   The patient was advised to call back or seek an in-person evaluation if the symptoms worsen or if the  condition fails to improve as anticipated.   I provided 21+ minutes of time during this encounter. Follow up plan: Return if symptoms worsen or fail to improve, for As scheduled.

## 2018-07-05 ENCOUNTER — Telehealth: Payer: Self-pay | Admitting: Family Medicine

## 2018-07-05 DIAGNOSIS — M961 Postlaminectomy syndrome, not elsewhere classified: Secondary | ICD-10-CM

## 2018-07-05 NOTE — Telephone Encounter (Signed)
Pt called in to inquire about getting Rating done per Laurelyn Sickle passing along to Dr Dossie Arbour to see about possible referral. Pt is also wanting paper work that was read to him from previous provider.

## 2018-07-06 NOTE — Telephone Encounter (Signed)
I am unclear on what is being asked.

## 2018-07-06 NOTE — Telephone Encounter (Signed)
Pt said that he discussed with you about getting a strength test done for disability during previous visit with you. Is this something that you want to refer the pt to have? Please advise.

## 2018-07-07 NOTE — Telephone Encounter (Signed)
Call pt Referral to physical therapy made patient should hear within a week. Patient should have paperwork to provide to physical therapy.

## 2018-07-07 NOTE — Telephone Encounter (Signed)
Message relayed to patient. Verbalized understanding and denied questions.   

## 2018-07-07 NOTE — Addendum Note (Signed)
Addended by: Vonita Moss A on: 07/07/2018 08:21 AM   Modules accepted: Orders

## 2018-07-07 NOTE — Telephone Encounter (Signed)
Patient was asking from form that was completed by "Dr. Channing Mutters" it is in the media section. Advised that he would need to contact his office as we cannot release another provider's medical records. Patient stated he's retired and the office is no longer there. I told patient I would check with office manger to see if there was anything we can do.

## 2018-08-04 ENCOUNTER — Encounter: Payer: Self-pay | Admitting: Physical Therapy

## 2018-08-04 ENCOUNTER — Other Ambulatory Visit: Payer: Self-pay

## 2018-08-04 ENCOUNTER — Ambulatory Visit: Payer: BLUE CROSS/BLUE SHIELD | Attending: Family Medicine | Admitting: Physical Therapy

## 2018-08-04 DIAGNOSIS — M542 Cervicalgia: Secondary | ICD-10-CM | POA: Diagnosis present

## 2018-08-04 DIAGNOSIS — M6281 Muscle weakness (generalized): Secondary | ICD-10-CM | POA: Insufficient documentation

## 2018-08-04 DIAGNOSIS — M5442 Lumbago with sciatica, left side: Secondary | ICD-10-CM | POA: Diagnosis present

## 2018-08-04 DIAGNOSIS — R202 Paresthesia of skin: Secondary | ICD-10-CM

## 2018-08-04 DIAGNOSIS — M5441 Lumbago with sciatica, right side: Secondary | ICD-10-CM | POA: Diagnosis present

## 2018-08-04 DIAGNOSIS — G8929 Other chronic pain: Secondary | ICD-10-CM | POA: Diagnosis present

## 2018-08-04 DIAGNOSIS — R262 Difficulty in walking, not elsewhere classified: Secondary | ICD-10-CM

## 2018-08-04 NOTE — Therapy (Addendum)
[facility] [address] [place], [address] Phone: [phone]   Fax:  [phone]  Physical Therapy Evaluation  Patient Details  Name: [name] MRN: [id number] Date of Birth: 08/31/1970 Referring Provider (PT): Wilnette Kales   Encounter Date: 08/04/2018  PT End of Session - 08/04/18 1852    Visit Number  1    Number of Visits  12    Date for PT Re-Evaluation  09/15/18    Authorization Type  [employer] reporting period from 08/04/2018    Authorization Time Period  Current cert period: 08/04/2018 - 09/15/2018 (latest PN: IE 08/04/2018);    Authorization - Visit Number  1    Authorization - Number of Visits  10    PT Start Time  1010    PT Stop Time  1130    PT Time Calculation (min)  80 min    Activity Tolerance  Patient limited by pain;Other (comment)   Patient reported increase in pain, but had not complications during exam   Behavior During Therapy  [facility] for tasks assessed/performed       Past Medical History:  Diagnosis Date  . DDD (degenerative disc disease), lumbar   . Dental bridge present    permanant - top left  . Headache    migraines - pinched nerve in neck - no mvmt limitations  . Hernia July 2014   umbilical  . Hypertension     Past Surgical History:  Procedure Laterality Date  . BACK SURGERY  2003, 2007, 2009, 2012   Dr Trey Sailors  . HERNIA REPAIR  2014   umbilical hernia  . KNEE SURGERY Right 1997, 2001   torn miniscus  . LUMBAR DISC SURGERY     X 3 with hardware  . NASAL TURBINATE REDUCTION Bilateral 10/26/2015   Procedure: TURBINATE REDUCTION/SUBMUCOSAL RESECTION;  Surgeon: Linus Salmons, MD;  Location: [facility];  Service: ENT;  Laterality: Bilateral;  . SEPTOPLASTY Bilateral 10/26/2015   Procedure: SEPTOPLASTY;  Surgeon: Linus Salmons, MD;  Location: [facility];  Service: ENT;  Laterality: Bilateral;    There were no vitals filed for this  visit.   Subjective Assessment - 08/04/18 1820    Subjective  Patient is a 48 y.o. male who presents to outpatient physical therapy with a referral for medical diagnosis of failed back syndrome of lumbar spine. This patient's chief complaints consist of inability to return to work, Agricultural consultant, hobbies, or PLOF due to chronic low back and neck pain with bilateral UE and LE numbness and pain that has not improved adequately with surgery, leading to the following functional deficits: severe limitation of his capacity for functional activities including difficulty with ADLs, IADLs, dressing, hygiene, sleeping, bed mobility, work activities including floor <> stand transfers, squatting, sit <> stand transfers, lifting, walking, bending, twisting, prolonged sitting, prolonged standing, prolonged walking, running, jumping, crawling, stabilizing equipment and loads with body, getting into and sustaining awkward positions. Relevant past medical history and comorbidities include HTN, hx of hernia repair, migraines, degenerative disc disease in cervical and lumbar spine, hyperlipidemia, at least 4 lumbar surgeries (last in September 2019 that included L3-4 posterior and interbody fusion and left partial facetectomy and left foraminotomy. Previous surgeries included interbody fusion at L4-5 and L5-S1), past cervical spine surgery (ACDF C6-7), multiple spinal injections, hx hernia repair x 2, hx of bilateral septoplasty and nasal turbinate reduction, hx of R knee surgeries for torn meniscus.    Pertinent History  See body of note for detailed history.    Limitations  Sitting;Lifting;Standing;Walking;House hold activities;Other (comment)   ADLs, IADLs, dressing, hygiene, sleeping, bed mobility, work activities including floor <> stand transfers, squatting, transfers, lifting, bending, twisting, sitting, standing, walking, running, jumping, crawling, stabilizing loads, awkward positions   How long can you sit comfortably?   Sitting time: 1.5-2 hours uncomfortable.    How long can you stand comfortably?  Standing time: 1.5-2 hours uncomfortable    How long can you walk comfortably?  Walking: about 5 min or so (but variable), half the store at [employer], very slow, uncomfortable.    Diagnostic tests  Imaging: MR Lumbar spine report 11/14/2018: "IMPRESSION: 1. Surgical changes from recent surgery with L3-4 posterior and interbody fusion and left partial facetectomy and left foraminotomy. No obvious complicating features such as hematoma. 2. Remote posterior and interbody fusion changes at L4-5 and L5-S1. 3. Normal appearance of L1-2 and L2-3." MR C-spine report 10/30/2016: "IMPRESSION: Mild spinal stenosis and mild foraminal stenosis bilaterally C3-4 due to spurring unchanged. Progression of left foraminal disc protrusion at C5-6 with associated spurring. Expected compression left C6 nerve root. ACDF C6-7."    Patient Stated Goals  to return to PLOF and decrease pain; to be approved for disability funding until he is able to return to work    Currently in Pain?  Yes    Pain Score  8    best 4-5/10, at worst 10/10   Pain Location  Back   also neck and "whole body"   Pain Orientation  Other (Comment)   Constant low back pain, intermittent circling of pain around left side to umbilicus. Intermittent B leg pain and paresthesia including numbness to both toes; neck He feels numbness in both arms all the way to the fingers   Pain Descriptors / Indicators  Aching;Dull;Numbness;Stabbing;Tingling;Constant   like someone punching him in the lower back intermittently, stabbing with a dagger in the upper shoulder blades; constant aching pain, dull pain. Like a toothache. Legs: intermittent numbness, tingling, pain (dull pain).   Pain Type  Chronic pain    Pain Radiating Towards  onstant low back pain, intermittent circling of pain around left side to umbilicus. Intermittent B leg pain and paresthesia including numbness to  both toes. Neck pain He feels numbness in both arms all the way to the fingers, sometimes just left, sometimes both arms. Once or twice R arm only. L is most severe. Almost feels like shoulder is out of place on L arm.    Pain Onset  More than a month ago    Pain Frequency  Constant    Aggravating Factors   laying down flat (on back) on reclining beds until foot is raised up he is in constant pain (pain in low back and running around left side to front, sharp pain), legs go numb in sitting an standing.   24-hour pattern: worse in the mornings (barely can get out of bed), can do things 2 hours at a time and he must go lay or sit down (most days lay down).   Pain Relieving Factors  legs better when laying on back with feet up, laying on stomach with pillow over belly (legs and back), ice, long hot shower.    Effect of Pain on Daily Activities  difficulty with  ADLs, IADLs, dressing, hygiene, sleeping, bed mobility, work activities including floor <> stand transfers, squatting, transfers, lifting, bending, twisting, sitting, standing, walking, running, jumping, crawling, stabilizing loads, awkward positions  Multiple Pain Sites  --   neck and arm symptoms (see body of note and above)      HISTORY:   Patient reports he was referred to physical therapy following chronic low back problems that has limited his ability to work and has resulted in multiple back surgeries over the past several years, the most recent of which was at the end of September 2019. According to his medical record Dr. Glenna Fellows performed L3-4 posterior and interbody fusion and and left partial facetectomy and left foraminotomy at that time. Patient reports he had noted improvement and relief from pain as he recovered from the surgery until about March or April 2020. He then experienced a return of pain similar to that which he had prior to the surgery. He also has a history of neck pain that provoked migraine headaches that was  improved by surgical intervention (records show ACDF at C6-7). He reports that prior to his recent back surgery both legs were going numb, L more than R. Back pain was constant, like a toothache. Leg pain and paresthesia was intermittent to toes. Prior to surgery he was able to walk but it was painful. The 2019 surgery initially improved pain with walking, he continued to have some numbness but not a bad as it was. It felt like the surgery released the pressure some but now it is back.  In march or first of April he started getting the pains back and it has progressed to a lot of numbness in his legs. He can be walking along fine and then all of a sudden his legs give out. He reports constant back pain in middle of low back, with intermittent radiation around left to umbilicus (laying down), feels like someone is punching him in the low back or kidney area. Both legs get intermittently numb. He can be walking and will take a different step (to turn or walk stright) and "it will just hit him" and he will feel like he "will go down." For this reason he carries a single point cane with him wherever he goes. He states he feels numbness in his leg before this happens, but he feels numbness in his legs a lot. It could be that 5 min later he feels like he is fine again and could walk without the cane, but he cannot predict when his legs will feel like giving out. Sometimes it takes a couple of hours to recover the strength in his legs, sometimes 5 min. He feels it is unpredictable. He states he has not had any falls in the last 6 months, which he attributes to being careful and using his cane. He reports many near misses, where he has caught himself before hitting the ground. He states if he tries to "do anything at all" ("piddle in his shop"), he is basically going out there to look at it and then is unable to get anything done. Two hours later he is in the bed due to increased pain. This also happens if he tries to mow  the lawn (has a Counsellor). He reports he can walk about 5 minutes or so but that it is variable and hard to predict how long he can work. He notes he has walked half the store at [employer] before but it is very slow and labored. Patient repots when the pain hits him he gets "ill" and no one wants to be around him and he does not want to be around anyone. He  states he "gets mean" and that this is a significant part of the way his condition is affecting him. He reports he thinks it is odd he can feel a bug landing on his leg but not something sharp pricking him.  He reports that a couple of weeks ago he suddenly lost use of the left thumb. And it was like that for about 1 week. It now intermittently comes and goes. He felt that both of his hands and arms swelled up at that time and he gets numbness and swelling in both hands intermittently now. He states he has a history of neck problems including a neck surgery in 2003 for migraine headaches (C-spine MRI report notes ACDF at C6-7). The surgery resovled the migraines for several years, now they come and go. He gets spinal injections for this and it helps. States arm symptoms just started in May 2020. He feels numbness in both arms all the way to the fingers, sometimes just left, sometimes both arms. Once or twice R arm only. L is most severe. Almost feels like shoulder is out of place on L arm. Denies previous shoulder injuries. States the paresthesia seemed to start at back of hand on thumb and digits 2 and 3, then spread to all digits. States he has constant neck pain except when he goes to chiropractor who "manipulates it back into place" describes as mostly pulling on his neck and "lets it realign on its own." Sees chiro as needed/can afford. Once a month or 5 times a month. Or 2 times a week. Currently went 1.5 weeks ago. Currently chiropractor only addresses neck. When he pops the neck and does the decompression, it relieves all of his pain (in  his neck, back, arms, and legs) and he feels great for about 2 hours but he still can't really do anything. Improvement lasts about 2 hours before his symptoms return.  He reports that presently his whole body aches. Low back. Left shoulder and down the arm. States both legs are numb and there is some amount of numbness in bilateral UEs to hands.   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 This required lifting 50-100# 4-5 times a day, sitting/driving, bending, crawling under vehicles, walking, standing, climbing, getting up and down from the ground.  Full time. 65 hours a week.  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 He last worked in April 2019 prior to his last lumbar surgery. This job required desk duties including phone work, dealing with customers, dealing with employees, mostly prolonged staying one place. He was unable to tolerate it and eventually had to stop after he "tried everything." States he did try standing desk and various ergonomic adaptations without success. He states that the emotional effect of his pain influenced his success in this position, that when the pain hit he "got mean" and no one wanted to be around him and he did not want to be around them.  Hobbies/Volunteer Work:  He states work was his main hobby. 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 States he is currently unable to work in any of these capacities at this point.    Patient estimates his tolerance for  the following:   Sitting time: 1.5-2 hours uncomfortable.   Standing time: 1.5-2 hours uncomfortable  Walking: about 5 min or so (but variable), half the store at [employer], very slow, uncomfortable.   Precautions: has not attempted lifting much of anything  for a year. He has progressed for about 10-15 pounds.   PAIN (Lumbar and LE Pain) Location: Constant low back pain, intermittent circling of pain around left side to umbilicus. Intermittent B leg pain and paresthesia including numbness to both toes.  Nature: like someone punching him in the lower back intermittently, stabbing with a dagger in the upper shoulder blades; constant aching pain, dull pain. Like a toothache. Legs: intermittent numbness, tingling, pain (dull pain).  Pain Scale:  Patient reports current pain as 8/10, at best 4-5/10, at worst 10/10 Paresthesia: bilateral legs to toes, intermittent.  Aggravating factors: laying down flat (on back) on reclining beds until foot is raised up he is in constant pain (pain in low back and running around left side to front, sharp pain), legs go numb in sitting an standing.  Easing factors: legs better when laying on back with feet up, laying on stomach with pillow over belly (legs and back), ice, long hot shower.  24-hour pattern: worse in the mornings (barely can get out of bed), can do things 2 hours at a time and he must go lay or sit down (most days lay down).   SPECIAL SCREENING QUESTIONS Dizziness, double vision, difficulty swallowing, difficulty speaking, fainting spells or blackouts, facial numbness, or nausea: dizzy from time to time (once every 2 months associated with pain) Recent illness or fever: denies Recent unexplained weight loss: denies Night pain/sweats: denies Recent bowel or bladder function abnormality, saddle paresthesia: confirms intermittent saddle paresthesia prior to surgery, cannot remember if it is after surgery. Denies bowel and bladder changes.  Current symptoms/concerns not elsewhere described: stabbing between shoulder blades that hits him from time to time (before and after surgery).   [facility] PT Assessment - 08/04/18 0001      Assessment   Medical Diagnosis  failed back syndrome of lumbar spine    Referring  Provider (PT)  Wilnette Kales    Onset Date/Surgical Date  11/03/18    Hand Dominance  Left    Prior Therapy  acute care physical therapy following surgery      Precautions   Precautions  Other (comment)    Precaution Comments   has not attempted lifting much of anything for a year. He has progressed for about 10-15 pounds.     Required Braces or Orthoses  --   no     Restrictions   Weight Bearing Restrictions  No      Balance Screen   Has the patient fallen in the past 6 months  No    Has the patient had a decrease in activity level because of a fear of falling?   Yes    Is the patient reluctant to leave their home because of a fear of falling?   Yes      Home Environment   Living Environment  Private residence    Living Arrangements  Spouse/significant other    Type of Home  House    Home Access  Stairs to enter    Entrance Stairs-Number of Steps  3    Home Layout  One level    Additional Comments  patient lives at home with his wife      Prior Function   Level  of Independence  Independent    Vocation  Full time employment    Vocation Requirements  tow truck Education officer, environmental. This required lifting 50-100# 4-5 times a day, sitting/driving, bending, crawling under vehicles, walking, standing, climbing, getting up and down from the ground.    see body of note for further details   Leisure  He states work was his main hobby. 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 States he is currently unable to work in any of these capacities at this point.        Cognition   Overall Cognitive Status  Within Functional Limits for tasks assessed      Observation/Other Assessments   Observations  see note from 08/04/2018 for latest objective data    Focus on Therapeutic Outcomes (FOTO)   FOTO = 37 (08/04/2018);        OBJECTIVE: OBSERVATION/INSPECTION: Patient presents with decreased lumbar and cervical lordosis consistent with history of spinal fusions, no obvious asymmetrical atrophy throughout body. Unable to determine if hands are swollen without baseline. Patient has moderately muscular build without excessive adipose tissue.  NEUROLOGICAL: Dermatomes: BUE dermatomes appear equal and intact to light touch except T1 tingling on L. (pt also reports bilateral numbness when not being touched, L > R). BLE dermatomes apparently equal and intact to light touch (pt reports feeling numb equally in both legs when not being touched).  Myotomes: BLE and BUE appear WNL. Weakness noted, but not obviously in a myotomal pattern (see below).  Reflexes:  - Biceps brachii reflex (C5, C6): R = 0, L = 0. - Brachioradialis reflex (C6): R = 0, L = 0. - Triceps brachii reflex (C7): R = 0, L = 2+. - Quadriceps reflex (L4): R = 2+, L = 0. - Achilles reflex (S1): R = 0, L = 2+. Upper Motor Neuron Screen: Babinski, Hoffman's, and Clonus (ankle) negative bilaterally.  SPINE MOTION Cervical Spine AROM:  All motions reported to be uncomfortable.  - Flexion: = 20 left c-spine pulling. - Extension: = 15. - Rotation: R= 40, L = 40. - Side Flexion: R= 25, L = 10 left c-spine pulling pain.  Lumbar AROM:  Patient reports pain with all motoins - Flexion: = severely limited, finger 2 inches above patella, abberant movement upon return (reports 10/10 back pain and leg weaknes). Legs not that bad. Virtually all motion came from hips.  - Extension: = severely limited, minimal movement at lumbar spine reports increase in back pain. States his legs are going numb.  - Rotation: B = ~ 75 percent of normal. - Side Flexion: B = 4 inches above patella  PERIPHERAL JOINT MOTION (AROM/PROM in degrees):  Notes all motions painful Shoulder  - Grossly WFL with mild loss of flexion.  Elbow - Flexion: mild loss of flexion R > L - Extension:  mild loss of extension bilaterally (pt states he has noted limited extension for many years).  - Wrist = grossly WFL Hip  - Hip grossly WFL except limitations in extension, significantly more painful at end range IR bilaterally.  Knee - B flexion and extension WNL (able to squat heels to buttocks, and stand with knees fully extended).  - Unable to sit in long sitting due to soft tissue restriction in back of legs (hamstring and/or neural  tension).  - Motion painful.  Ankle: Grossly WFL for basic functional work activities except painful.    STRENGTH:  Reports pain with all movements. Shoulder (reports pain with all movements)  - Flexion: R = 3/5, L = 3/5.  - Abduction: R = 4/5, L = 4/5. - External rotation: R = 4-/5, L = 4-/5. - Internal rotation: R = 4+/5, L = 4+/5. Elbow - Flexion: R = 3/5, L = 3/5. - Extension: R = 4-/5, L = 4-/5. Grip strength (in pounds, average of three measures).  - R: (35+26+25)/3 = 28.7 - L: (38+30+35)/ 3 = 34.3 Hip  - Flexion: R = 2/5, L = 2/5. - Extension:  Able to lift buttocks off mat in bridge position, painful! - Abduction: R = 4+/5, L = 3+/5. Knee - Ext: R = 4/5, L = 4/5. (unable to straighten when seated - straight in standing).  - Flex: R = 4+/5, L = 3+/5. Ankle (seated position) - Dorsiflexion: R = 4+/5, L = 4/5. - Eversion: R = 5/5, L = 4+/5. - Great toe extension: R = 5/5, L = 4+/5. - Able to heel walk and toe walk bilaterally without UE support. Unstable on toes but able to regain balance independently without UE support.   REPEATED MOTIONS TESTING: Deferred  SPECIAL TESTS:  Straight leg raise (SLR): B = positive to sensitizing maneuver, occasionally reports some pain in contralateral R leg, but unclear if it is a positive crossed straight leg test.   Slump: B = exteremely limited in range (unclear if it is soft tissue restriction - hamstring/calf or neural) report back pain that worsens when extends neck. Reports no change in leg  symptoms.    Cervical Axial Compresoin: neck pain, no change in B UE or B LE  Cervical Axial Distraction: thoracic pain, no change in B UE or B LE  Spurlings: neck pain, no change in B UE or B LE  ACCESSORY MOTION:  - deferred  PALPATION: - deferred  FUNCTIONAL MOBILITY: - Bed mobility: sit <> supine, and rolling independent with difficulty and slow movement due to pain and stiffness.  - Transfers: sit <> stand from chair and chair height plinth independent with very slow and aberent motion rising using hands to walk up legs.  - Gait: WFL but painful for household and short community distances, testing for longer distances deferred due to time limitation.  - Stairs: testing deferred  FUNCTIONAL/BALANCE TESTS:  Squat (self selected): squats with back upright, slowly with maximal flexion at knees and ankles. Buttocks meets heels. Very slow painful movement, difficulty rising. No lumbar flexion, minimal forward incline of trunk from hips.   Squat (instructed to keep heels down and complete a more traditional squat): pt with limited ability to complete without heels coming up as he allowed minimal forward inclination of his trunk. No spinal flexion noted and all motion coming from hips. Very shallow.   Light object retrieval from the floor: squatted down buttocks to heels as in first squat attempt (see above), then lowered R knee to floor, supported hand on knee, and reached with left to get object off floor. Painful and slow. No lumbar flexion and limited forward inclination of trunk.  EDUCATION/COGNITION: Patient is alert and oriented X 4.  Objective measurements completed on examination: See above findings.     PT Education - 08/04/18 1852    Education Details  Examination purpose/form. Education on diagnosis, prognosis, POC, anatomy and physiology of current condition. Introduced complexity of pain.  Person(s) Educated  Patient    Methods  Explanation;Demonstration;Tactile  cues;Verbal cues    Comprehension  Verbalized understanding;Returned demonstration;Verbal cues required;Tactile cues required;Need further instruction      PT Short Term Goals - 08/04/18 1909      PT SHORT TERM GOAL #1   Title  Be independent with initial home exercise program for self-management of symptoms.    Baseline  Initial HEP to be provided at visit 2 (08/04/2018);    Time  2    Period  Weeks    Status  New    Target Date  08/18/18      PT Long Term Goals - 08/04/18 1910      PT LONG TERM GOAL #1   Title  Be independent with a long-term home exercise program for self-management of symptoms.    Baseline  Initial HEP to provided at visit 2 (08/04/2018);    Time  6    Period  Weeks    Status  New    Target Date  09/15/18      PT LONG TERM GOAL #2   Title  Demonstrate improved FOTO score by 10 units to demonstrate improvement in overall condition and self-reported functional ability.    Baseline  FOTO = 37 (08/04/2018);    Time  6    Period  Weeks    Status  New    Target Date  09/15/18      PT LONG TERM GOAL #3   Title  Be able to squat to chair height with proper form without limitation due to current condition in order to improve ability to lift and complete transfers during usual and work activities.    Baseline  see objective exam (08/04/2018);    Time  6    Period  Weeks    Status  New    Target Date  09/15/18      PT LONG TERM GOAL #4   Title  Patient will demonstrate B UE and LE strength 4+/5 or higher to demonstrate functional strength for improved gait, increased standing tolerance, reaching, lifting, carrying and work activities.    Baseline  see objective exam (08/04/2018);    Time  6    Period  Weeks    Status  New    Target Date  09/15/18      PT LONG TERM GOAL #5   Title  Be able to demonstrate floor to waist lift with proper form without limitation due to current condition in order to improve ability to lift during usual and work activities.    Baseline   see objective exam (08/04/2018);    Time  6    Period  Weeks    Status  New    Target Date  09/15/18      Additional Long Term Goals   Additional Long Term Goals  Yes      PT LONG TERM GOAL #6   Title  Patient will be able to return to full time employment without limitaiton due to current condition.    Baseline  currently unable to tolerate work activities - see subjective and objective exam (08/04/2018);    Time  6    Period  Weeks    Status  New    Target Date  09/15/18         Plan - 08/04/18 1902    Clinical Impression Statement  Patient is a 48 y.o. male referred to outpatient physical therapy with a medical diagnosis  of failed back syndrome of lumbar spine who presents with signs and symptoms consistent with chronic low back and neck pain with intermittent referral to and paresthesia affecting the B UE and B LE. Objective creening for myelopathy did not suggest it was present, but subjective reports of improvement of LE symptoms from cervical traction performed by chiropractor as well as being able to feel light touch but not pin prick suggests it. May benefit from further medical evaluation. Widespread pain that does not stay within dermatomal patterns without myotomal weakness suggests chronic pain syndrome and central sensitization is contributing to pain experience and poor function. Patient presents with significant pain, ROM, strength, activity tolerance, joint stiffness, and paresthesia impairments that are severly limiting ability to complete functional activities including difficulty with ADLs, IADLs, dressing, hygiene, sleeping, bed mobility, work activities including floor <> stand transfers, squatting, sit <> stand transfers, lifting, walking, bending, twisting, prolonged sitting, prolonged standing, prolonged walking, running, jumping, crawling, stabilizing equipment and loads with body, getting into and sustaining awkward positions. Patient will benefit from skilled physical  therapy intervention to address current body structure impairments and activity limitations to improve function and work towards goals set in current POC in order to return to prior level of function or maximal functional improvement.    Personal Factors and Comorbidities  Time since onset of injury/illness/exacerbation;Past/Current Experience;Comorbidity 3+    Comorbidities  HTN, hx of hernia repair, migraines, degenerative disc disease in cervical and lumbar spine, hyperlipidemia, at least 4 lumbar surgeries (last in September 2019 that included L3-4 posterior and interbody fusion and left partial facetectomy and left foraminotomy. Previous surgeries included interbody fusion at L4-5 and L5-S1), past cervical spine surgery (ACDF C6-7), multiple spinal injections, hx hernia repair x 2, hx of bilateral septoplasty and nasal turbinate reduction, hx of R knee surgeries for torn meniscus.    Examination-Activity Limitations  Bathing;Hygiene/Grooming;Squat;Stairs;Lift;Bed Mobility;Bend;Stand;Toileting;Carry;Transfers;Sit;Dressing    Examination-Participation Restrictions  Cleaning;Laundry;Shop;Volunteer;Community Activity;Yard Work;Driving;Interpersonal Relationship   unable to work, Engineer, maintenancevolunteer   Clinical Decision Making  High    Rehab Potential  Fair    PT Frequency  2x / week    PT Duration  6 weeks    PT Treatment/Interventions  ADLs/Self Care Home Management;Aquatic Therapy;Cryotherapy;Electrical Stimulation;Moist Heat;Therapeutic activities;Therapeutic exercise;Neuromuscular re-education;Patient/family education;Manual techniques;Passive range of motion;Dry needling;Joint Manipulations;Spinal Manipulations;Other (comment)   joint mobilizations grades I-IV   PT Next Visit Plan  consider testing repeated motions, 6MWT, stairs. Establish initial HEP, start graded funcitonal exercise as tolerated.    PT Home Exercise Plan  To be established next session    Recommended Other Services  Patient may need  referral for Functional Capacit Exam for more thorough evaluation of functional capacity and strength    Consulted and Agree with Plan of Care  Patient       Patient will benefit from skilled therapeutic intervention in order to improve the following deficits and impairments:  Decreased endurance, Decreased mobility, Difficulty walking, Hypomobility, Increased muscle spasms, Impaired sensation, Decreased range of motion, Decreased scar mobility, Impaired perceived functional ability, Improper body mechanics, Decreased activity tolerance, Decreased strength, Impaired flexibility, Impaired UE functional use, Pain  Visit Diagnosis: 1. Chronic bilateral low back pain with bilateral sciatica   2. Cervicalgia   3. Paresthesia of skin   4. Muscle weakness (generalized)   5. Difficulty in walking, not elsewhere classified        Problem List Patient Active Problem List   Diagnosis Date Noted  . 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 Ruptured lumbar  disc 10/09/2017  . Rhinitis medicamentosa 07/06/2015  . DDD (degenerative disc disease), lumbar 07/20/2014  . Hyperlipidemia 07/20/2014  . Hypertension 07/20/2014  . DDD (degenerative disc disease), cervical 07/20/2014  . Umbilical hernia 08/31/2012    Luretha MurphySara R. Ilsa IhaSnyder, PT, DPT 08/04/18, 7:22 PM  [facility] [address] [place], [place], [address] Phone: [phone]   Fax:  [phone]  Name: Leota SauersMichael J Coscia MRN: [id number] Date of Birth: 10-21-1970

## 2018-08-09 ENCOUNTER — Ambulatory Visit: Payer: BLUE CROSS/BLUE SHIELD

## 2018-08-09 ENCOUNTER — Encounter: Payer: Self-pay | Admitting: Physical Therapy

## 2018-08-09 ENCOUNTER — Other Ambulatory Visit: Payer: Self-pay

## 2018-08-09 DIAGNOSIS — M5442 Lumbago with sciatica, left side: Secondary | ICD-10-CM | POA: Diagnosis not present

## 2018-08-09 DIAGNOSIS — R262 Difficulty in walking, not elsewhere classified: Secondary | ICD-10-CM

## 2018-08-09 DIAGNOSIS — M542 Cervicalgia: Secondary | ICD-10-CM

## 2018-08-09 DIAGNOSIS — M6281 Muscle weakness (generalized): Secondary | ICD-10-CM

## 2018-08-09 DIAGNOSIS — G8929 Other chronic pain: Secondary | ICD-10-CM

## 2018-08-09 DIAGNOSIS — R202 Paresthesia of skin: Secondary | ICD-10-CM

## 2018-08-09 NOTE — Therapy (Signed)
[facility] [address] [place], [place], [address] Phone: [phone]   Fax:  [phone]  Physical Therapy Treatment  Patient Details  Name: [name] MRN: [id number] Date of Birth: 02-17-1970 Referring Provider (PT): Wilnette KalesMark Crisman   Encounter Date: 08/09/2018  PT End of Session - 08/09/18 1216    Visit Number  2    Number of Visits  12    Date for PT Re-Evaluation  09/15/18    Authorization Type  [employer] reporting period from 08/04/2018    Authorization Time Period  Current cert period: 08/04/2018 - 09/15/2018 (latest PN: IE 08/04/2018);    Authorization - Visit Number  2    Authorization - Number of Visits  10    PT Start Time  1115    PT Stop Time  1204    PT Time Calculation (min)  49 min    Activity Tolerance  Patient limited by pain;Patient limited by fatigue    Behavior During Therapy  [facility]WFL for tasks assessed/performed       Past Medical History:  Diagnosis Date  . DDD (degenerative disc disease), lumbar   . Dental bridge present    permanant - top left  . Headache    migraines - pinched nerve in neck - no mvmt limitations  . Hernia July 2014   umbilical  . Hypertension     Past Surgical History:  Procedure Laterality Date  . BACK SURGERY  2003, 2007, 2009, 2012   Dr Trey SailorsMark Roy  . HERNIA REPAIR  2014   umbilical hernia  . KNEE SURGERY Right 1997, 2001   torn miniscus  . LUMBAR DISC SURGERY     X 3 with hardware  . NASAL TURBINATE REDUCTION Bilateral 10/26/2015   Procedure: TURBINATE REDUCTION/SUBMUCOSAL RESECTION;  Surgeon: Linus Salmonshapman McQueen, MD;  [facility];  Service: ENT;  Laterality: Bilateral;  . SEPTOPLASTY Bilateral 10/26/2015   Procedure: SEPTOPLASTY;  Surgeon: Linus Salmonshapman McQueen, MD;  [facility];  Service: ENT;  Laterality: Bilateral;    There were no vitals filed for this visit.  Subjective Assessment - 08/09/18 1117    Subjective  Patient stated  after his evaluation he was sore, but not unmanagable. Rescheduled his appt this AM due to overlseeping, did not sleep well. Stated he saw his neurologist after his PT visit and will be getting a study for wrist carpal tunnel (bilaterally).    Pertinent History  See body of note for detailed history.    Limitations  Sitting;Lifting;Standing;Walking;House hold activities;Other (comment)    How long can you sit comfortably?  Sitting time: 1.5-2 hours uncomfortable.    How long can you stand comfortably?  Standing time: 1.5-2 hours uncomfortable    How long can you walk comfortably?  Walking: about 5 min or so (but variable), half the store at [employer], very slow, uncomfortable.    Diagnostic tests  Imaging: MR Lumbar spine report 11/14/2018: "IMPRESSION: 1. Surgical changes from recent surgery with L3-4 posterior and interbody fusion and left partial facetectomy and left foraminotomy. No obvious complicating features such as hematoma. 2. Remote posterior and interbody fusion changes at L4-5 and L5-S1. 3. Normal appearance of L1-2 and L2-3." MR C-spine report 10/30/2016: "IMPRESSION: Mild spinal stenosis and mild foraminal stenosis bilaterally C3-4 due to spurring unchanged. Progression of left foraminal disc protrusion at C5-6 with associated spurring. Expected compression left C6 nerve root. ACDF C6-7."    Patient Stated Goals  to  return to PLOF and decrease pain; to be approved for disability funding until he is able to return to work    Currently in Pain?  Yes    Pain Score  5    8 when upon awaking   Pain Location  Back    Pain Orientation  Left;Mid    Pain Descriptors / Indicators  Aching;Sore    Pain Type  Chronic pain    Pain Onset  More than a month ago    Pain Frequency  Constant       TREATMENT:  Session addressed limitations in muscle length for bilateral hips. PT assist for all stretches, unable to perform independently due to increased pain, especially with positional  changes.  -hooklying hip flexion/ piriformis stretch 3x30sec holds bilaterally, increased restriction on L compared to R, pt with complaints of L anterior hip tightness (decreased R foot numbness reported with this stretch) -hooklying hamstring stretch 3x30sec holds bilaterally, increased restriction on L compared to R. -hooklying popliteal hamstring stretch in 90 deg hip flexion, with ankle DF/PF for -gentle nerve flossing x10 bilaterally (pt reported increased R foot numbness, resolved when RLE lowered to table) -sidelying hip flexor stretch bilaterally 3x30secs, more restriction noted on L than R, able to increase R side to near neutral positioning of R hip at end of stretch, L ~30deg from neutral.  Ambulated ~14400ft without SPC after stretches to assess response. Pt stated he felt better, "actually quite a bit better" Stated his pain was 4-5/10 instead of just purely 5/10.    standing posture corrections at wall. Cues for buttocks against wall, gentle scapular retraction, core activation, and cervical retraction. 5 x 5-7sec hold.   Pt reported increased tension and LE weakness after standing (may be due to neutral positioning, pt often in flexed position/and due to poor activity tolerance due to increased pain)  Transverse abdominis activation seated in chair, verbal/visual cues, instructed in importance of core activation with functional activities.  Pt response/clinical impression: Pt demonstrated improved gait mechanics/quality after bilateral hip stretching this session. Exhibited increased step length, minimally decreased trunk flexion, and improvement in gait velocity. Pt also reported that he "felt better" after stretches as well. Intermittent complaints of R foot numbness during session as well as pt "shaking out" LUE due to numbness. The patient would benefit from further skilled PT intervention to continue to assess response, progress towards goals, and to improve pt's ability to perform  functional activities.     PT Education - 08/09/18 1117    Education Details  exercise form/technique, posture    Person(s) Educated  Patient    Methods  Explanation;Demonstration;Tactile cues;Verbal cues    Comprehension  Verbalized understanding;Returned demonstration;Verbal cues required;Tactile cues required       PT Short Term Goals - 08/04/18 1909      PT SHORT TERM GOAL #1   Title  Be independent with initial home exercise program for self-management of symptoms.    Baseline  Initial HEP to be provided at visit 2 (08/04/2018);    Time  2    Period  Weeks    Status  New    Target Date  08/18/18        PT Long Term Goals - 08/04/18 1910      PT LONG TERM GOAL #1   Title  Be independent with a long-term home exercise program for self-management of symptoms.    Baseline  Initial HEP to provided at visit 2 (08/04/2018);    Time  6    Period  Weeks    Status  New    Target Date  09/15/18      PT LONG TERM GOAL #2   Title  Demonstrate improved FOTO score by 10 units to demonstrate improvement in overall condition and self-reported functional ability.    Baseline  FOTO = 37 (08/04/2018);    Time  6    Period  Weeks    Status  New    Target Date  09/15/18      PT LONG TERM GOAL #3   Title  Be able to squat to chair height with proper form without limitation due to current condition in order to improve ability to lift and complete transfers during usual and work activities.    Baseline  see objective exam (08/04/2018);    Time  6    Period  Weeks    Status  New    Target Date  09/15/18      PT LONG TERM GOAL #4   Title  Patient will demonstrate B UE and LE strength 4+/5 or higher to demonstrate functional strength for improved gait, increased standing tolerance, reaching, lifting, carrying and work activities.    Baseline  see objective exam (08/04/2018);    Time  6    Period  Weeks    Status  New    Target Date  09/15/18      PT LONG TERM GOAL #5   Title  Be able to  demonstrate floor to waist lift with proper form without limitation due to current condition in order to improve ability to lift during usual and work activities.    Baseline  see objective exam (08/04/2018);    Time  6    Period  Weeks    Status  New    Target Date  09/15/18      Additional Long Term Goals   Additional Long Term Goals  Yes      PT LONG TERM GOAL #6   Title  Patient will be able to return to full time employment without limitaiton due to current condition.    Baseline  currently unable to tolerate work activities - see subjective and objective exam (08/04/2018);    Time  6    Period  Weeks    Status  New    Target Date  09/15/18            Plan - 08/09/18 1206    Clinical Impression Statement  Pt demonstrated improved gait mechanics/quality after bilateral hip stretching this session. Exhibited increased step length, minimally decreased trunk flexion, and improvement in gait velocity. Pt also reported that he "felt better" after stretches as well. Intermittent complaints of R foot numbness during session as well as pt "shaking out" LUE due to numbness. The patient would benefit from further skilled PT intervention to continue to assess response, progress towards goals, and to improve pt's ability to perform functional activities.    Personal Factors and Comorbidities  Time since onset of injury/illness/exacerbation;Past/Current Experience;Comorbidity 3+    Comorbidities  HTN, hx of hernia repair, migraines, degenerative disc disease in cervical and lumbar spine, hyperlipidemia, at least 4 lumbar surgeries (last in September 2019 that included L3-4 posterior and interbody fusion and left partial facetectomy and left foraminotomy. Previous surgeries included interbody fusion at L4-5 and L5-S1), past cervical spine surgery (ACDF C6-7), multiple spinal injections, hx hernia repair x 2, hx of bilateral septoplasty and nasal turbinate reduction, hx of R  knee surgeries for torn  meniscus.    Examination-Activity Limitations  Bathing;Hygiene/Grooming;Squat;Stairs;Lift;Bed Mobility;Bend;Stand;Toileting;Carry;Transfers;Sit;Dressing    Examination-Participation Restrictions  Cleaning;Laundry;Shop;Volunteer;Community Activity;Yard Work;Driving;Interpersonal Relationship    Rehab Potential  Fair    PT Frequency  2x / week    PT Duration  6 weeks    PT Treatment/Interventions  ADLs/Self Care Home Management;Aquatic Therapy;Cryotherapy;Electrical Stimulation;Moist Heat;Therapeutic activities;Therapeutic exercise;Neuromuscular re-education;Patient/family education;Manual techniques;Passive range of motion;Dry needling;Joint Manipulations;Spinal Manipulations;Other (comment)    PT Next Visit Plan  consider testing repeated motions, 6MWT, stairs. Establish initial HEP, start graded funcitonal exercise as tolerated.    PT Home Exercise Plan  To be established next session       Patient will benefit from skilled therapeutic intervention in order to improve the following deficits and impairments:  Decreased endurance, Decreased mobility, Difficulty walking, Hypomobility, Increased muscle spasms, Impaired sensation, Decreased range of motion, Decreased scar mobility, Impaired perceived functional ability, Improper body mechanics, Decreased activity tolerance, Decreased strength, Impaired flexibility, Impaired UE functional use, Pain  Visit Diagnosis: 1. Chronic bilateral low back pain with bilateral sciatica   2. Paresthesia of skin   3. Muscle weakness (generalized)   4. Difficulty in walking, not elsewhere classified   5. Cervicalgia        Problem List Patient Active Problem List   Diagnosis Date Noted  . 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 Ruptured lumbar disc 10/09/2017  . Rhinitis medicamentosa 07/06/2015  . DDD (degenerative disc disease), lumbar 07/20/2014  . Hyperlipidemia 07/20/2014  . Hypertension 07/20/2014  . DDD (degenerative disc disease), cervical  07/20/2014  . Umbilical hernia 07/86/7544    Lieutenant Diego PT, DPT 12:17 PM,08/09/18 [phone]  [facility] [address] [place], [address] Phone: [phone]   Fax:  [phone]  Name: [name] MRN: [id number] Date of Birth: 1970-11-06

## 2018-08-10 ENCOUNTER — Telehealth: Payer: Self-pay | Admitting: Family Medicine

## 2018-08-10 NOTE — Telephone Encounter (Signed)
Copied from [place] [phone]. Topic: General - Other >> Aug 10, 2018  2:16 PM Rainey Pines A wrote: Patient would like a a letter going to insurance company in regards to his referral and he would like an update in regards to his pain management referral as well.

## 2018-08-10 NOTE — Telephone Encounter (Signed)
Please check on status of referral to pain management.   Also- I'm not sure what he means about a letter to his insurance company. Please check what he needs, and let him know that Dr. Jeananne Rama is out of the office, and will get this for him when he's back.

## 2018-08-10 NOTE — Telephone Encounter (Signed)
Patient states that his disability insurance was d/c earlier this year after having went for evaluation. Pt states that Dr. 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 Pt stated that Dr. Jeananne Rama stated that he would right pt a letter requesting review of disability. Advised pt I would pass along the information but normally we do not do anything in regards to disability insurance.   As for Pain management, relayed information that I learned by calling [facility]. Pt states he only has 10 or so pain pills left and needs to get in somewhere. Would like for Referral coordinator to look for other clinics and options. Please advise.

## 2018-08-10 NOTE — Telephone Encounter (Signed)
This will need to wait for PCP determination if he can write this letter

## 2018-08-10 NOTE — Telephone Encounter (Signed)
Spoke w/ Anderson Malta @ [facility]. They've accepted the referral but are not scheduling new patients at this time.

## 2018-08-11 ENCOUNTER — Ambulatory Visit: Payer: BLUE CROSS/BLUE SHIELD

## 2018-08-11 ENCOUNTER — Other Ambulatory Visit: Payer: Self-pay

## 2018-08-11 DIAGNOSIS — R262 Difficulty in walking, not elsewhere classified: Secondary | ICD-10-CM

## 2018-08-11 DIAGNOSIS — G8929 Other chronic pain: Secondary | ICD-10-CM

## 2018-08-11 DIAGNOSIS — M542 Cervicalgia: Secondary | ICD-10-CM

## 2018-08-11 DIAGNOSIS — M5442 Lumbago with sciatica, left side: Secondary | ICD-10-CM

## 2018-08-11 DIAGNOSIS — M6281 Muscle weakness (generalized): Secondary | ICD-10-CM

## 2018-08-11 DIAGNOSIS — R202 Paresthesia of skin: Secondary | ICD-10-CM

## 2018-08-11 NOTE — Therapy (Signed)
[facility] [place] [address], [place], [address] Phone: [phone]   Fax:  [phone]  Physical Therapy Treatment  Patient Details  Name: [name] MRN: [id number] Date of Birth: 08-08-1970 Referring Provider (PT): Haywood Lasso   Encounter Date: 08/11/2018  PT End of Session - 08/11/18 1153    Visit Number  3    Number of Visits  12    Date for PT Re-Evaluation  09/15/18    Authorization Type  [employer] reporting period from 08/04/2018    Authorization Time Period  Current cert period: 10/06/7900 - 09/15/2018 (latest PN: IE 08/04/2018);    Authorization - Visit Number  3    Authorization - Number of Visits  10    PT Start Time  1030    PT Stop Time  1115    PT Time Calculation (min)  45 min    Activity Tolerance  Patient limited by pain    Behavior During Therapy  [facility] for tasks assessed/performed       Past Medical History:  Diagnosis Date  . DDD (degenerative disc disease), lumbar   . Dental bridge present    permanant - top left  . Headache    migraines - pinched nerve in neck - no mvmt limitations  . Hernia July 4097   umbilical  . Hypertension     Past Surgical History:  Procedure Laterality Date  . BACK SURGERY  2003, 2007, 2009, 2012   Dr Glenna Fellows  . HERNIA REPAIR  3532   umbilical hernia  . KNEE SURGERY Right 1997, 2001   torn miniscus  . LUMBAR DISC SURGERY     X 3 with hardware  . NASAL TURBINATE REDUCTION Bilateral 10/26/2015   Procedure: TURBINATE REDUCTION/SUBMUCOSAL RESECTION;  Surgeon: Beverly Gust, MD;  Location: [facility];  Service: ENT;  Laterality: Bilateral;  . SEPTOPLASTY Bilateral 10/26/2015   Procedure: SEPTOPLASTY;  Surgeon: Beverly Gust, MD;  Location: [facility];  Service: ENT;  Laterality: Bilateral;    There were no vitals filed for this visit.  Subjective Assessment - 08/11/18 1032    Subjective  Patient reported that he is sore this AM,  about the same as usual. Stated that he had to lay down after therapy on Monday due to increased soreness, but when asked agreeable that the soreness was not concordant pain but more similar to work out pain.    Pertinent History  See body of note for detailed history.    Limitations  Sitting;Lifting;Standing;Walking;House hold activities;Other (comment)    How long can you sit comfortably?  Sitting time: 1.5-2 hours uncomfortable.    How long can you stand comfortably?  Standing time: 1.5-2 hours uncomfortable    How long can you walk comfortably?  Walking: about 5 min or so (but variable), half the store at [employer], very slow, uncomfortable.    Diagnostic tests  Imaging: MR Lumbar spine report 11/14/2018: "IMPRESSION: 1. Surgical changes from recent surgery with L3-4 posterior and interbody fusion and left partial facetectomy and left foraminotomy. No obvious complicating features such as hematoma. 2. Remote posterior and interbody fusion changes at L4-5 and L5-S1. 3. Normal appearance of L1-2 and L2-3." MR C-spine report 10/30/2016: "IMPRESSION: Mild spinal stenosis and mild foraminal stenosis bilaterally C3-4 due to spurring unchanged. Progression of left foraminal disc protrusion at C5-6 with associated spurring. Expected compression left C6 nerve root. ACDF C6-7."    Patient Stated Goals  to  return to PLOF and decrease pain; to be approved for disability funding until he is able to return to work    Currently in Pain?  Yes    Pain Score  8     Pain Location  Back    Pain Orientation  Left;Mid    Pain Descriptors / Indicators  Aching;Sore;Sharp    Pain Type  Chronic pain    Pain Onset  More than a month ago        TREATMENT:  Session addressed limitations in muscle length for bilateral hips and response to flexion based exercises compared to extension. PT assist for all stretches, unable to perform independently due to increased pain, especially with positional changes.    -hooklying hip flexion/ piriformis stretch 3x30sec holds bilaterally,   increased restriction on L compared to R, pt with complaints of L anterior hip  tightness (some foot numbness reported with stretching) -Gentle hip IR/ER PROM x10 bilaterally pt reported no pain/discomfort with these  Motions -lower trunk rotations 1.5 mins with PT assist to guide motion -hooklying popliteal hamstring stretch in 90 deg hip flexion -gentle nerve flossing x10 bilaterally (pt reported increased R foot numbness,  resolved when RLE lowered to table) Leg positioned in hip flexion, knee  flexion, PROM knee extension AROM DF at TKE, relaxed to PF and returned  to knee flexion. Pt reported a sharp R sided pain with L leg flossing when  returning to resting position, resolved within 1 minute. -sidelying hip flexor stretch bilaterally 3x30secs, more restriction noted on L than R,  able to increase R side to near neutral positioning of R hip at end of stretch, L  ~30deg from neutral. -Pt asked about the last time he laid on his stomach, he stated he does not do it very often, but it "sometimes" helps his pain. Pt positioned with pillow underneath hips and able to lay in prone ~351min, but reported that his R leg started to go numb. Resolved in sitting completely. Further extension/prone lying deferred this session.     Pt Ambulated ~1700ft without SPC after stretches to assess response. Pt stated he felt better, pain decreased from 8/10 to 5/10.   Pt response/clinical impression: This session pt demonstrated flexion as a preferential direction to address pain. Pt reported decrease in pain after flexion based stretches. Prone lying assessed due to pt reporting that sometimes this helps decrease his pain but experienced peripheralizing symptoms this session. The patient would benefit from further skilled PT to continue to assess direction preference, to address his pain, and improve abilities to perform functional activities. HEP  should be updated next session to potentially include flexion based exercises to assess response and some stretches to decrease muscle tension.    PT Education - 08/11/18 1144    Education Details  exercise form/technique, HEP, condition    Person(s) Educated  Patient    Methods  Explanation;Demonstration;Verbal cues    Comprehension  Verbalized understanding;Returned demonstration;Need further instruction       PT Short Term Goals - 08/04/18 1909      PT SHORT TERM GOAL #1   Title  Be independent with initial home exercise program for self-management of symptoms.    Baseline  Initial HEP to be provided at visit 2 (08/04/2018);    Time  2    Period  Weeks    Status  New    Target Date  08/18/18        PT Long Term Goals - 08/04/18 1910  PT LONG TERM GOAL #1   Title  Be independent with a long-term home exercise program for self-management of symptoms.    Baseline  Initial HEP to provided at visit 2 (08/04/2018);    Time  6    Period  Weeks    Status  New    Target Date  09/15/18      PT LONG TERM GOAL #2   Title  Demonstrate improved FOTO score by 10 units to demonstrate improvement in overall condition and self-reported functional ability.    Baseline  FOTO = 37 (08/04/2018);    Time  6    Period  Weeks    Status  New    Target Date  09/15/18      PT LONG TERM GOAL #3   Title  Be able to squat to chair height with proper form without limitation due to current condition in order to improve ability to lift and complete transfers during usual and work activities.    Baseline  see objective exam (08/04/2018);    Time  6    Period  Weeks    Status  New    Target Date  09/15/18      PT LONG TERM GOAL #4   Title  Patient will demonstrate B UE and LE strength 4+/5 or higher to demonstrate functional strength for improved gait, increased standing tolerance, reaching, lifting, carrying and work activities.    Baseline  see objective exam (08/04/2018);    Time  6    Period   Weeks    Status  New    Target Date  09/15/18      PT LONG TERM GOAL #5   Title  Be able to demonstrate floor to waist lift with proper form without limitation due to current condition in order to improve ability to lift during usual and work activities.    Baseline  see objective exam (08/04/2018);    Time  6    Period  Weeks    Status  New    Target Date  09/15/18      Additional Long Term Goals   Additional Long Term Goals  Yes      PT LONG TERM GOAL #6   Title  Patient will be able to return to full time employment without limitaiton due to current condition.    Baseline  currently unable to tolerate work activities - see subjective and objective exam (08/04/2018);    Time  6    Period  Weeks    Status  New    Target Date  09/15/18            Plan - 08/11/18 1144    Clinical Impression Statement  This session pt demonstrated flexion as a preferential direction to address pain. Pt reported decrease in pain after flexion based stretches. Prone lying assessed due to pt reporting that sometimes this helps decrease his pain but experienced peripheralizing symptoms this session. The patient would benefit from further skilled PT to continue to assess direction preference, to address his pain, and improve abilities to perform functional activities. HEP should be updated next session to potentially include flexion based exercises to assess response and some stretches to decrease muscle tension.    Personal Factors and Comorbidities  Time since onset of injury/illness/exacerbation;Past/Current Experience;Comorbidity 3+    Comorbidities  HTN, hx of hernia repair, migraines, degenerative disc disease in cervical and lumbar spine, hyperlipidemia, at least 4 lumbar surgeries (last in September 2019  that included L3-4 posterior and interbody fusion and left partial facetectomy and left foraminotomy. Previous surgeries included interbody fusion at L4-5 and L5-S1), past cervical spine surgery (ACDF  C6-7), multiple spinal injections, hx hernia repair x 2, hx of bilateral septoplasty and nasal turbinate reduction, hx of R knee surgeries for torn meniscus.    Examination-Activity Limitations  Bathing;Hygiene/Grooming;Squat;Stairs;Lift;Bed Mobility;Bend;Stand;Toileting;Carry;Transfers;Sit;Dressing    Examination-Participation Restrictions  Cleaning;Laundry;Shop;Volunteer;Community Activity;Yard Work;Driving;Interpersonal Relationship    Rehab Potential  Fair    PT Frequency  2x / week    PT Duration  6 weeks    PT Treatment/Interventions  ADLs/Self Care Home Management;Aquatic Therapy;Cryotherapy;Electrical Stimulation;Moist Heat;Therapeutic activities;Therapeutic exercise;Neuromuscular re-education;Patient/family education;Manual techniques;Passive range of motion;Dry needling;Joint Manipulations;Spinal Manipulations;Other (comment)    PT Next Visit Plan  consider testing repeated motions, 6MWT, stairs. Establish initial HEP, start graded funcitonal exercise as tolerated.    PT Home Exercise Plan  To be established next session    Consulted and Agree with Plan of Care  Patient       Patient will benefit from skilled therapeutic intervention in order to improve the following deficits and impairments:  Decreased endurance, Decreased mobility, Difficulty walking, Hypomobility, Increased muscle spasms, Impaired sensation, Decreased range of motion, Decreased scar mobility, Impaired perceived functional ability, Improper body mechanics, Decreased activity tolerance, Decreased strength, Impaired flexibility, Impaired UE functional use, Pain  Visit Diagnosis: 1. Chronic bilateral low back pain with bilateral sciatica   2. Paresthesia of skin   3. Muscle weakness (generalized)   4. Difficulty in walking, not elsewhere classified   5. Cervicalgia        Problem List Patient Active Problem List   Diagnosis Date Noted  . 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 Ruptured lumbar disc  10/09/2017  . Rhinitis medicamentosa 07/06/2015  . DDD (degenerative disc disease), lumbar 07/20/2014  . Hyperlipidemia 07/20/2014  . Hypertension 07/20/2014  . DDD (degenerative disc disease), cervical 07/20/2014  . Umbilical hernia 08/31/2012    Olga Coasteriana [place] PT, DPT 11:54 AM,08/11/18 [phone]  [facility] [address] [place], [place], [address] Phone: [phone]   Fax:  [phone]  Name: Charles SauersMichael J Cunningham MRN: [id number] Date of Birth: [birth year]

## 2018-08-16 ENCOUNTER — Other Ambulatory Visit: Payer: Self-pay

## 2018-08-16 ENCOUNTER — Ambulatory Visit: Payer: BLUE CROSS/BLUE SHIELD | Admitting: Physical Therapy

## 2018-08-16 ENCOUNTER — Encounter: Payer: Self-pay | Admitting: Physical Therapy

## 2018-08-16 DIAGNOSIS — M542 Cervicalgia: Secondary | ICD-10-CM

## 2018-08-16 DIAGNOSIS — G8929 Other chronic pain: Secondary | ICD-10-CM

## 2018-08-16 DIAGNOSIS — R262 Difficulty in walking, not elsewhere classified: Secondary | ICD-10-CM

## 2018-08-16 DIAGNOSIS — M6281 Muscle weakness (generalized): Secondary | ICD-10-CM

## 2018-08-16 DIAGNOSIS — M5441 Lumbago with sciatica, right side: Secondary | ICD-10-CM

## 2018-08-16 DIAGNOSIS — R202 Paresthesia of skin: Secondary | ICD-10-CM

## 2018-08-16 DIAGNOSIS — M5442 Lumbago with sciatica, left side: Secondary | ICD-10-CM | POA: Diagnosis not present

## 2018-08-16 NOTE — Therapy (Signed)
[facility] [address] [place], [address] Phone: [phone]   Fax:  [phone]  Physical Therapy Treatment  Patient Details  Name: [name] MRN: [id number] Date of Birth: 1970/07/15 Referring Provider (PT): Haywood Lasso   Encounter Date: 08/16/2018  PT End of Session - 08/16/18 1123    Visit Number  4    Number of Visits  12    Date for PT Re-Evaluation  09/15/18    Authorization Type  [employer] reporting period from 08/04/2018    Authorization Time Period  Current cert period: 05/12/1789 - 09/15/2018 (latest PN: IE 08/04/2018);    Authorization - Visit Number  4    Authorization - Number of Visits  10    PT Start Time  1030    PT Stop Time  1115    PT Time Calculation (min)  45 min    Activity Tolerance  Patient limited by pain    Behavior During Therapy  [facility] for tasks assessed/performed       Past Medical History:  Diagnosis Date  . DDD (degenerative disc disease), lumbar   . Dental bridge present    permanant - top left  . Headache    migraines - pinched nerve in neck - no mvmt limitations  . Hernia July 5056   umbilical  . Hypertension     Past Surgical History:  Procedure Laterality Date  . BACK SURGERY  2003, 2007, 2009, 2012   Dr Glenna Fellows  . HERNIA REPAIR  [id number]   umbilical hernia  . KNEE SURGERY Right 1997, 2001   torn miniscus  . LUMBAR DISC SURGERY     X 3 with hardware  . NASAL TURBINATE REDUCTION Bilateral 10/26/2015   Procedure: TURBINATE REDUCTION/SUBMUCOSAL RESECTION;  Surgeon: Beverly Gust, MD;  Location: [place];  Service: ENT;  Laterality: Bilateral;  . SEPTOPLASTY Bilateral 10/26/2015   Procedure: SEPTOPLASTY;  Surgeon: Beverly Gust, MD;  Location: [place];  Service: ENT;  Laterality: Bilateral;    There were no vitals filed for this visit.  Subjective Assessment - 08/16/18 1030    Subjective  Patient reports that over the weekend he  could not close his hands. States his hands are swollen. Has not noticed any difference in how much his low back pain comes but he feels it is a bit better. He reports no numbness in his legs upon arrival. He reports he felt good for "a little bit" and then as the day went on he got sore and stiff, but not more than usual.    Pertinent History  See body of note for detailed history.    Limitations  Sitting;Lifting;Standing;Walking;House hold activities;Other (comment)    How long can you sit comfortably?  Sitting time: 1.5-2 hours uncomfortable.    How long can you stand comfortably?  Standing time: 1.5-2 hours uncomfortable    How long can you walk comfortably?  Walking: about 5 min or so (but variable), half the store at [employer], very slow, uncomfortable.    Diagnostic tests  Imaging: MR Lumbar spine report 11/14/2018: "IMPRESSION: 1. Surgical changes from recent surgery with L3-4 posterior and interbody fusion and left partial facetectomy and left foraminotomy. No obvious complicating features such as hematoma. 2. Remote posterior and interbody fusion changes at L4-5 and L5-S1. 3. Normal appearance of L1-2 and L2-3." MR C-spine report 10/30/2016: "IMPRESSION: Mild spinal stenosis and mild foraminal stenosis bilaterally C3-4 due to spurring unchanged.  Progression of left foraminal disc protrusion at C5-6 with associated spurring. Expected compression left C6 nerve root. ACDF C6-7."    Patient Stated Goals  to return to PLOF and decrease pain; to be approved for disability funding until he is able to return to work    Currently in Pain?  Yes    Pain Score  7     Pain Location  Back    Pain Orientation  Left    Pain Descriptors / Indicators  Sharp    Pain Type  Chronic pain    Pain Radiating Towards  left low back to umbillicus    Pain Onset  More than a month ago      TREATMENT:  Session addressed limitations in muscle length for bilateral hips and response to flexion based exercises  compared to extension. PT assist for all stretches, unable to perform independently due to increased pain, especially with positional changes.  Therapeutic exercise: to centralize symptoms and improve ROM, strength, muscular endurance, and activity tolerance required for successful completion of functional activities.  -lower trunk rotations 2 mins with PT assist to guide motion, instructed in breathing each rotation. -hooklying figure 4/piriformis stretch 3x30sec holds bilaterally (L side required more time to ease into position for first rep). PROM clinician assist. increased restriction on L compared to R, pt with complaints of R lateral hip pain while stretching each side (B feet paresthesia reported with stretching) -Gentle hip IR/ER PROM x10 bilaterally with mild OP. pt reported end range discomfort with these motions, no worse following. Feet tingling.  -gentle nerve flossing x10 PROM the x10 AROM with towel behind knee bilaterally. Leg positioned in 90 degrees hip flexion, then alternated between knee extension with PF and knee flexion with DF. Reported difficulty holding towel up because if difficulty gripping with hands, so switched to clinician assistance to hold hip in 90 degrees flexion. No tingling noted after.(added to HEP). - Education on HEP including handout  -hooklying popliteal hamstring stretch in 90 deg hip flexion, 3x30 seconds each.  -sidelying hip flexor stretch bilaterally 3x30secs, more restriction noted on L than R, able to increase R side to near neutral positioning of R hip at end of stretch, L  ~30deg from neutral.  - Pt Ambulated ~127f with SPC (for safety) after stretches to assess response. Pt states his pain is still 7/10 but now in different places. States he has no numbness or tingling in BLE. He reports he feels a bit worse due to increased pain in the R low back.  Pt response/clinical impression: Patient consistently reported end range discomfort with stretches  that relented upon release of stretch. He reported some paresthesia in BLE to feet during/following piriformis stretch but it resolved with nerve glide and was not re-provoked. Patient reported decreased L low back pain and leg symptoms but increased R LBP to R iliac crest by end of session. Did not attempt end range lumbar positions this session due to lack of improvement in previous sessions. The patient would benefit from further skilled PT to continue to assess direction preference, to address his pain, and improve abilities to perform functional activities. HEP should be updated next session to potentially include flexion based exercises to assess response and some stretches to decrease muscle tension.  HOME EXERCISE PROGRAM Sciatic Nerve Flossing While lying on your back, bring your knee up towards your chest and support it with your hands. Begin with knee flexed and toe pointed. Straighten your knee until you feel the  tension increase in your leg. Then bring your toes towards you and begin to flex your knee until you return to the start position. Repeat. You may be able to use a towel or sheet to hold your leg up. Putting loops in the ends of the sheet would allow you to hook your arms through so you are not needing to grip as hard. Repeat 10 per set, Complete 1 Set, Perform 2 Times a Day    PT Education - 08/16/18 1123    Education Details  Exercise purpose/form. Self management techniques. Education on diagnosis, prognosis, POC, anatomy and physiology of current condition Education on HEP including handout    Person(s) Educated  Patient    Methods  Explanation;Demonstration;Tactile cues;Verbal cues;Handout    Comprehension  Verbalized understanding;Returned demonstration;Verbal cues required;Tactile cues required;Need further instruction       PT Short Term Goals - 08/16/18 1203      PT SHORT TERM GOAL #1   Title  Be independent with initial home exercise program for self-management of  symptoms.    Baseline  Initial HEP to be provided at visit 2 (08/04/2018); pt provided with initial HEP, delayed due to lack of tolerance to activity (08/16/2018);    Time  2    Period  Weeks    Status  Partially Met    Target Date  08/18/18        PT Long Term Goals - 08/04/18 1910      PT LONG TERM GOAL #1   Title  Be independent with a long-term home exercise program for self-management of symptoms.    Baseline  Initial HEP to provided at visit 2 (08/04/2018);    Time  6    Period  Weeks    Status  New    Target Date  09/15/18      PT LONG TERM GOAL #2   Title  Demonstrate improved FOTO score by 10 units to demonstrate improvement in overall condition and self-reported functional ability.    Baseline  FOTO = 37 (08/04/2018);    Time  6    Period  Weeks    Status  New    Target Date  09/15/18      PT LONG TERM GOAL #3   Title  Be able to squat to chair height with proper form without limitation due to current condition in order to improve ability to lift and complete transfers during usual and work activities.    Baseline  see objective exam (08/04/2018);    Time  6    Period  Weeks    Status  New    Target Date  09/15/18      PT LONG TERM GOAL #4   Title  Patient will demonstrate B UE and LE strength 4+/5 or higher to demonstrate functional strength for improved gait, increased standing tolerance, reaching, lifting, carrying and work activities.    Baseline  see objective exam (08/04/2018);    Time  6    Period  Weeks    Status  New    Target Date  09/15/18      PT LONG TERM GOAL #5   Title  Be able to demonstrate floor to waist lift with proper form without limitation due to current condition in order to improve ability to lift during usual and work activities.    Baseline  see objective exam (08/04/2018);    Time  6    Period  Weeks    Status  New    Target Date  09/15/18      Additional Long Term Goals   Additional Long Term Goals  Yes      PT LONG TERM GOAL #6    Title  Patient will be able to return to full time employment without limitaiton due to current condition.    Baseline  currently unable to tolerate work activities - see subjective and objective exam (08/04/2018);    Time  6    Period  Weeks    Status  New    Target Date  09/15/18            Plan - 08/16/18 1121    Clinical Impression Statement  Patient consistently reported end range discomfort with stretches that relented upon release of stretch. He reported some paresthesia in BLE to feet during/following piriformis stretch but it resolved with nerve glide and was not re-provoked. Patient reported decreased L low back pain and leg symptoms but increased R LBP to R iliac crest by end of session. Did not attempt end range lumbar positions this session due to lack of improvement in previous sessions. The patient would benefit from further skilled PT to continue to assess direction preference, to address his pain, and improve abilities to perform functional activities. HEP should be updated next session to potentially include flexion based exercises to assess response and some stretches to decrease muscle tension.    Personal Factors and Comorbidities  Time since onset of injury/illness/exacerbation;Past/Current Experience;Comorbidity 3+    Comorbidities  HTN, hx of hernia repair, migraines, degenerative disc disease in cervical and lumbar spine, hyperlipidemia, at least 4 lumbar surgeries (last in September 2019 that included L3-4 posterior and interbody fusion and left partial facetectomy and left foraminotomy. Previous surgeries included interbody fusion at L4-5 and L5-S1), past cervical spine surgery (ACDF C6-7), multiple spinal injections, hx hernia repair x 2, hx of bilateral septoplasty and nasal turbinate reduction, hx of R knee surgeries for torn meniscus.    Examination-Activity Limitations  Bathing;Hygiene/Grooming;Squat;Stairs;Lift;Bed  Mobility;Bend;Stand;Toileting;Carry;Transfers;Sit;Dressing    Examination-Participation Restrictions  Cleaning;Laundry;Shop;Volunteer;Community Activity;Yard Work;Driving;Interpersonal Relationship    Rehab Potential  Fair    PT Frequency  2x / week    PT Duration  6 weeks    PT Treatment/Interventions  ADLs/Self Care Home Management;Aquatic Therapy;Cryotherapy;Electrical Stimulation;Moist Heat;Therapeutic activities;Therapeutic exercise;Neuromuscular re-education;Patient/family education;Manual techniques;Passive range of motion;Dry needling;Joint Manipulations;Spinal Manipulations;Other (comment)    PT Next Visit Plan  consider testing repeated motions, 6MWT, stairs. Establish initial HEP, start graded funcitonal exercise as tolerated.    PT Home Exercise Plan  Sciatic Nerve Flossing    Consulted and Agree with Plan of Care  Patient       Patient will benefit from skilled therapeutic intervention in order to improve the following deficits and impairments:  Decreased endurance, Decreased mobility, Difficulty walking, Hypomobility, Increased muscle spasms, Impaired sensation, Decreased range of motion, Decreased scar mobility, Impaired perceived functional ability, Improper body mechanics, Decreased activity tolerance, Decreased strength, Impaired flexibility, Impaired UE functional use, Pain  Visit Diagnosis: 1. Chronic bilateral low back pain with bilateral sciatica   2. Paresthesia of skin   3. Muscle weakness (generalized)   4. Difficulty in walking, not elsewhere classified   5. Cervicalgia        Problem List Patient Active Problem List   Diagnosis Date Noted  . 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 Ruptured lumbar disc 10/09/2017  . Rhinitis medicamentosa 07/06/2015  . DDD (degenerative disc disease), lumbar 07/20/2014  . Hyperlipidemia 07/20/2014  .  Hypertension 07/20/2014  . DDD (degenerative disc disease), cervical 07/20/2014  . Umbilical hernia 55/02/5866   Everlean Alstrom. Graylon Good, PT, DPT 08/16/18, 12:04 PM  [facility] [address], [place], [address] Phone: [phone]   Fax:  [phone]  Name: [name] MRN: [id number] Date of Birth: 28-Apr-1970

## 2018-08-18 ENCOUNTER — Other Ambulatory Visit: Payer: Self-pay

## 2018-08-18 ENCOUNTER — Encounter: Payer: Self-pay | Admitting: Physical Therapy

## 2018-08-18 ENCOUNTER — Ambulatory Visit: Payer: BLUE CROSS/BLUE SHIELD | Admitting: Physical Therapy

## 2018-08-18 DIAGNOSIS — R262 Difficulty in walking, not elsewhere classified: Secondary | ICD-10-CM

## 2018-08-18 DIAGNOSIS — M5442 Lumbago with sciatica, left side: Secondary | ICD-10-CM | POA: Diagnosis not present

## 2018-08-18 DIAGNOSIS — G8929 Other chronic pain: Secondary | ICD-10-CM

## 2018-08-18 DIAGNOSIS — M5441 Lumbago with sciatica, right side: Secondary | ICD-10-CM

## 2018-08-18 DIAGNOSIS — M6281 Muscle weakness (generalized): Secondary | ICD-10-CM

## 2018-08-18 DIAGNOSIS — R202 Paresthesia of skin: Secondary | ICD-10-CM

## 2018-08-18 DIAGNOSIS — M542 Cervicalgia: Secondary | ICD-10-CM

## 2018-08-18 NOTE — Therapy (Signed)
[facility] [address], [place], [address] Phone: [phone]   Fax:  [phone]  Physical Therapy Treatment  Patient Details  Name: [name] MRN: [id number] Date of Birth: 10/06/1970 Referring Provider (PT): Haywood Lasso   Encounter Date: 08/18/2018  PT End of Session - 08/18/18 1042    Visit Number  5    Number of Visits  12    Date for PT Re-Evaluation  09/15/18    Authorization Type  [employer] reporting period from 08/04/2018    Authorization Time Period  Current cert period: 06/08/3873 - 09/15/2018 (latest PN: IE 08/04/2018);    Authorization - Visit Number  5    Authorization - Number of Visits  10    PT Start Time  6433    PT Stop Time  1115    PT Time Calculation (min)  43 min    Activity Tolerance  Patient limited by pain    Behavior During Therapy  [facility] for tasks assessed/performed       Past Medical History:  Diagnosis Date  . DDD (degenerative disc disease), lumbar   . Dental bridge present    permanant - top left  . Headache    migraines - pinched nerve in neck - no mvmt limitations  . Hernia July 2951   umbilical  . Hypertension     Past Surgical History:  Procedure Laterality Date  . BACK SURGERY  2003, 2007, 2009, 2012   Dr Glenna Fellows  . HERNIA REPAIR  [id number]   umbilical hernia  . KNEE SURGERY Right 1997, 2001   torn miniscus  . LUMBAR DISC SURGERY     X 3 with hardware  . NASAL TURBINATE REDUCTION Bilateral 10/26/2015   Procedure: TURBINATE REDUCTION/SUBMUCOSAL RESECTION;  Surgeon: Beverly Gust, MD;  Location: [facility];  Service: ENT;  Laterality: Bilateral;  . SEPTOPLASTY Bilateral 10/26/2015   Procedure: SEPTOPLASTY;  Surgeon: Beverly Gust, MD;  Location: [facility];  Service: ENT;  Laterality: Bilateral;    There were no vitals filed for this visit.  Subjective Assessment - 08/18/18 1033    Subjective  Patient reports he has a bit of a pinch in  his lower back and he feels "halfway decent" today. He was able to "piddle a bit" yesterday. He states his hands went numb. Pain comes and goes. Pain is 5-6/10 in left lumbar spine with a bit coming around to the front, but more centered today at the belt line. He tried the nerve floss which seemed to help paresthesia in his leg. He reports very little tingling in his left leg upon arrival.    Pertinent History  See body of note for detailed history.    Limitations  Sitting;Lifting;Standing;Walking;House hold activities;Other (comment)    How long can you sit comfortably?  Sitting time: 1.5-2 hours uncomfortable.    How long can you stand comfortably?  Standing time: 1.5-2 hours uncomfortable    How long can you walk comfortably?  Walking: about 5 min or so (but variable), half the store at [employer], very slow, uncomfortable.    Diagnostic tests  Imaging: MR Lumbar spine report 11/14/2018: "IMPRESSION: 1. Surgical changes from recent surgery with L3-4 posterior and interbody fusion and left partial facetectomy and left foraminotomy. No obvious complicating features such as hematoma. 2. Remote posterior and interbody fusion changes at L4-5 and L5-S1. 3. Normal appearance of L1-2 and L2-3." MR C-spine report 10/30/2016: "IMPRESSION: Mild spinal  stenosis and mild foraminal stenosis bilaterally C3-4 due to spurring unchanged. Progression of left foraminal disc protrusion at C5-6 with associated spurring. Expected compression left C6 nerve root. ACDF C6-7."    Patient Stated Goals  to return to PLOF and decrease pain; to be approved for disability funding until he is able to return to work    Currently in Pain?  Yes    Pain Score  6     Pain Location  Back    Pain Descriptors / Indicators  Sharp    Pain Type  Chronic pain    Pain Radiating Towards  tingling in leg    Pain Onset  More than a month ago          TREATMENT:  Session addressed limitations in muscle length for bilateral hipsand  response to flexion based exercises compared to extension. PT assist for all stretches, unable to perform independently due to increased pain, especially with positional changes.  Therapeutic exercise:to centralize symptoms and improve ROM, strength, muscular endurance, and activity tolerance required for successful completion of functional activities.  -lower trunk rotations 2 mins, instructed in breathing each rotation and using abdominal muscles to help bring legs back to neutral. Added theraball and moist heat pack for second set of 2 minute.   Moist heat placed behind back during following exercises to promote muscle relaxation and patient comfort:  - supine double knees to chest with legs on theraball, to improve hip mobility and strength and core strength. X 2 minutes. Cuing to brace abdominal muscles and for breathing.  -hooklying figure 4/piriformis stretch 3x30sec holds bilaterally (L side required more time to ease into position for first rep). PROM clinician assist. increased restriction on L compared to R, but L showed more improvement with repeated stretches than R. pt with complaints of R lateral hip pain while stretching each side(R feet paresthesia reported with R stretching, resolved with rest) -gentle nerve flossing x10 AROM each side. Leg positioned in 90 degrees hip flexion, then alternated between knee extension with PF and knee flexion with DF.  Good reproduction of exercise with minimal cuing.  - Hooklying dead bug (hooklying with knees, and shoulders flexed to 90 starting position then alternating hip/knee extension and contralateral shoulder flexion with abdominal activation). Ball held above abdomen pressing isomerically into knees with hands. 3 second holds, 2x5 each side. plus time for instruction, rest, and transition. cuing for core activation.  (removed heat)  -sidelying hip flexor stretch bilaterally 3x30 secs. PROM with therapist assistance.  - Wall sit, back against  wall with knees/hips flexed to about 90 degrees (patient self-selected after demonstration). X 20 seconds, x15 seconds. Patient reports pain in knees as limiting factor.  - Total gym squats, level 26. Patient reports knee pain at 90 degree squat, instructed to limit range to stop prior to pain. Patient limited to about 80 degree squat but states it is about the same. Very slow and guarded. X10. Patient reported sudden shooting pain to 10/10 on last repthat decreased to 7/10 following.    HOME EXERCISE PROGRAM Sciatic Nerve Flossing While lying on your back, bring your knee up towards your chest and support it with your hands. Begin with knee flexed and toe pointed. Straighten your knee until you feel the tension increase in your leg. Then bring your toes towards you and begin to flex your knee until you return to the start position. Repeat. You may be able to use a towel or sheet to hold your leg  up. Putting loops in the ends of the sheet would allow you to hook your arms through so you are not needing to grip as hard. Repeat 10 per set, Complete 1 Set, Perform 2 Times a Day    PT Education - 08/18/18 1042    Education Details  exercise form/technique,    Person(s) Educated  Patient    Methods  Explanation;Demonstration;Tactile cues;Verbal cues    Comprehension  Verbalized understanding;Returned demonstration;Verbal cues required;Tactile cues required       PT Short Term Goals - 08/16/18 1203      PT SHORT TERM GOAL #1   Title  Be independent with initial home exercise program for self-management of symptoms.    Baseline  Initial HEP to be provided at visit 2 (08/04/2018); pt provided with initial HEP, delayed due to lack of tolerance to activity (08/16/2018);    Time  2    Period  Weeks    Status  Partially Met    Target Date  08/18/18        PT Long Term Goals - 08/04/18 1910      PT LONG TERM GOAL #1   Title  Be independent with a long-term home exercise program for  self-management of symptoms.    Baseline  Initial HEP to provided at visit 2 (08/04/2018);    Time  6    Period  Weeks    Status  New    Target Date  09/15/18      PT LONG TERM GOAL #2   Title  Demonstrate improved FOTO score by 10 units to demonstrate improvement in overall condition and self-reported functional ability.    Baseline  FOTO = 37 (08/04/2018);    Time  6    Period  Weeks    Status  New    Target Date  09/15/18      PT LONG TERM GOAL #3   Title  Be able to squat to chair height with proper form without limitation due to current condition in order to improve ability to lift and complete transfers during usual and work activities.    Baseline  see objective exam (08/04/2018);    Time  6    Period  Weeks    Status  New    Target Date  09/15/18      PT LONG TERM GOAL #4   Title  Patient will demonstrate B UE and LE strength 4+/5 or higher to demonstrate functional strength for improved gait, increased standing tolerance, reaching, lifting, carrying and work activities.    Baseline  see objective exam (08/04/2018);    Time  6    Period  Weeks    Status  New    Target Date  09/15/18      PT LONG TERM GOAL #5   Title  Be able to demonstrate floor to waist lift with proper form without limitation due to current condition in order to improve ability to lift during usual and work activities.    Baseline  see objective exam (08/04/2018);    Time  6    Period  Weeks    Status  New    Target Date  09/15/18      Additional Long Term Goals   Additional Long Term Goals  Yes      PT LONG TERM GOAL #6   Title  Patient will be able to return to full time employment without limitaiton due to current condition.    Baseline  currently unable to tolerate work activities - see subjective and objective exam (08/04/2018);    Time  6    Period  Weeks    Status  New    Target Date  09/15/18            Plan - 08/18/18 1135    Clinical Impression Statement  Patient continues to have  very high levels of pain in low tolerance for activity. Introduced slightly progressed core, hip, and functional exercises as a graded program to improve activity tolerance while continuing to provide interventions to address pain. Patient reported intermittent paresthesia with stretches that resolved with rest. Patient continues to be very limited by pain and guarded movement. Patient would benefit from continued physical therapy to address remaining impairments and functional limitations to work towards stated goals and return to PLOF or maximal functional independence    Personal Factors and Comorbidities  Time since onset of injury/illness/exacerbation;Past/Current Experience;Comorbidity 3+    Comorbidities  HTN, hx of hernia repair, migraines, degenerative disc disease in cervical and lumbar spine, hyperlipidemia, at least 4 lumbar surgeries (last in September 2019 that included L3-4 posterior and interbody fusion and left partial facetectomy and left foraminotomy. Previous surgeries included interbody fusion at L4-5 and L5-S1), past cervical spine surgery (ACDF C6-7), multiple spinal injections, hx hernia repair x 2, hx of bilateral septoplasty and nasal turbinate reduction, hx of R knee surgeries for torn meniscus.    Examination-Activity Limitations  Bathing;Hygiene/Grooming;Squat;Stairs;Lift;Bed Mobility;Bend;Stand;Toileting;Carry;Transfers;Sit;Dressing    Examination-Participation Restrictions  Cleaning;Laundry;Shop;Volunteer;Community Activity;Yard Work;Driving;Interpersonal Relationship    Rehab Potential  Fair    PT Frequency  2x / week    PT Duration  6 weeks    PT Treatment/Interventions  ADLs/Self Care Home Management;Aquatic Therapy;Cryotherapy;Electrical Stimulation;Moist Heat;Therapeutic activities;Therapeutic exercise;Neuromuscular re-education;Patient/family education;Manual techniques;Passive range of motion;Dry needling;Joint Manipulations;Spinal Manipulations;Other (comment)    PT  Next Visit Plan  graded funcitonal exercise as tolerated, pain control    PT Home Exercise Plan  Sciatic Nerve Flossing    Consulted and Agree with Plan of Care  Patient       Patient will benefit from skilled therapeutic intervention in order to improve the following deficits and impairments:  Decreased endurance, Decreased mobility, Difficulty walking, Hypomobility, Increased muscle spasms, Impaired sensation, Decreased range of motion, Decreased scar mobility, Impaired perceived functional ability, Improper body mechanics, Decreased activity tolerance, Decreased strength, Impaired flexibility, Impaired UE functional use, Pain  Visit Diagnosis: 1. Chronic bilateral low back pain with bilateral sciatica   2. Paresthesia of skin   3. Muscle weakness (generalized)   4. Difficulty in walking, not elsewhere classified   5. Cervicalgia        Problem List Patient Active Problem List   Diagnosis Date Noted  . 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 Ruptured lumbar disc 10/09/2017  . Rhinitis medicamentosa 07/06/2015  . DDD (degenerative disc disease), lumbar 07/20/2014  . Hyperlipidemia 07/20/2014  . Hypertension 07/20/2014  . DDD (degenerative disc disease), cervical 07/20/2014  . Umbilical hernia 37/90/2409    Everlean Alstrom. Graylon Good, PT, DPT 08/18/18, 11:45 AM  [facility] [address] [place], [address] Phone: [phone]   Fax:  [phone]  Name: [name] MRN: [id number] Date of Birth: February 20, 1970

## 2018-08-23 ENCOUNTER — Other Ambulatory Visit: Payer: Self-pay

## 2018-08-23 ENCOUNTER — Ambulatory Visit: Payer: BLUE CROSS/BLUE SHIELD

## 2018-08-23 DIAGNOSIS — R202 Paresthesia of skin: Secondary | ICD-10-CM

## 2018-08-23 DIAGNOSIS — G8929 Other chronic pain: Secondary | ICD-10-CM

## 2018-08-23 DIAGNOSIS — M6281 Muscle weakness (generalized): Secondary | ICD-10-CM

## 2018-08-23 DIAGNOSIS — M5442 Lumbago with sciatica, left side: Secondary | ICD-10-CM | POA: Diagnosis not present

## 2018-08-23 NOTE — Telephone Encounter (Signed)
Call pt Patient will re-need to submit his disability application if that does not work he will need to hire a Chief Executive Officer.

## 2018-08-23 NOTE — Therapy (Signed)
[facility] [address], [place], [address] Phone: [phone]   Fax:  [phone]  Physical Therapy Treatment  Patient Details  Name: [name] MRN: [id number] Date of Birth: 1970-10-04 Referring Provider (PT): Haywood Lasso   Encounter Date: 08/23/2018  PT End of Session - 08/23/18 1153    Visit Number  6    Number of Visits  12    Date for PT Re-Evaluation  09/15/18    Authorization Type  [employer] reporting period from 08/04/2018    Authorization Time Period  Current cert period: 07/12/6293 - 09/15/2018 (latest PN: IE 08/04/2018);    Authorization - Visit Number  6    Authorization - Number of Visits  10    PT Start Time  1030    PT Stop Time  1115    PT Time Calculation (min)  45 min    Activity Tolerance  Patient tolerated treatment well    Behavior During Therapy  WFL for tasks assessed/performed       Past Medical History:  Diagnosis Date  . DDD (degenerative disc disease), lumbar   . Dental bridge present    permanant - top left  . Headache    migraines - pinched nerve in neck - no mvmt limitations  . Hernia July 2841   umbilical  . Hypertension     Past Surgical History:  Procedure Laterality Date  . BACK SURGERY  2003, 2007, 2009, 2012   Dr Glenna Fellows  . HERNIA REPAIR  3244   umbilical hernia  . KNEE SURGERY Right 1997, 2001   torn miniscus  . LUMBAR DISC SURGERY     X 3 with hardware  . NASAL TURBINATE REDUCTION Bilateral 10/26/2015   Procedure: TURBINATE REDUCTION/SUBMUCOSAL RESECTION;  Surgeon: Beverly Gust, MD;  Location: [facility];  Service: ENT;  Laterality: Bilateral;  . SEPTOPLASTY Bilateral 10/26/2015   Procedure: SEPTOPLASTY;  Surgeon: Beverly Gust, MD;  Location: [facility];  Service: ENT;  Laterality: Bilateral;    There were no vitals filed for this visit.  Subjective Assessment - 08/23/18 1044    Subjective  Patient reports his lower back  has been slowly improving over the past few sessions and feels like the 'Leg stretching' has been beneficial. Patient reports he continues to have difficulty with transferring from sit to supine and from sit to standing. Patient states he feel like he will slowly get better. 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    Pertinent History  See body of note for detailed history.    Limitations  Sitting;Lifting;Standing;Walking;House hold activities;Other (comment)    How long can you sit comfortably?  Sitting time: 1.5-2 hours uncomfortable.    How long can you stand comfortably?  Standing time: 1.5-2 hours uncomfortable    How long can you walk comfortably?  Walking: about 5 min or so (but variable), half the store at [employer], very slow, uncomfortable.    Diagnostic tests  Imaging: MR Lumbar spine report 11/14/2018: "IMPRESSION: 1. Surgical changes from recent surgery with L3-4 posterior and interbody fusion and left partial facetectomy and left foraminotomy. No obvious complicating features such as hematoma. 2. Remote posterior and interbody fusion changes at L4-5 and L5-S1. 3. Normal appearance of L1-2 and L2-3." MR C-spine report 10/30/2016: "IMPRESSION: Mild spinal stenosis and mild foraminal stenosis bilaterally C3-4 due to spurring unchanged. Progression of  left foraminal disc protrusion at C5-6 with associated spurring. Expected compression left C6 nerve root. ACDF C6-7."    Patient Stated Goals  to return to PLOF and decrease pain; to be approved for disability funding until he is able to return to work    Currently in Pain?  Yes    Pain Score  6     Pain Location  Back    Pain Orientation  Left    Pain Descriptors / Indicators  Sharp    Pain Type  Chronic pain    Pain Onset  More than a month ago    Pain Frequency  Constant       TREATMENT Therapeutic Exercise Glute squeese in hooklying -- x30 Bridges in  hooklying -- x 20 (able to raise around 1in) performed with mobilization belt to engage external rotators of the hip LTRs in hooklying -- 30 LTRs with feet on ball -- x 30 Knees to chest with physioball underfeet and patient in supine -- 2 min  Standing lumbar rotation-- x 3 min with bar behind back going through a comfortable AROM Standing Lumbar extension with hands on counter -- x 20 performed throughout pain-free range  SLUMP nerve glides in sitting with LE straight performing dorsiflexion/plantarflexion -- x 23mn SLUMP sliders - performing cervical extension with knee flexion; cervical flexion with knee flexion Ambulation 2066fwith focus on improving stride length- no increase in symptoms throughout ambulation  Performed exercises to improve mobility and the lumbar spine and decrease muscular guarding throughout.   PT Education - 08/23/18 1152    Education Details  Education on performing timed ambulation to address back pain.    Person(s) Educated  Patient    Methods  Explanation;Demonstration    Comprehension  Verbalized understanding;Returned demonstration       PT Short Term Goals - 08/16/18 1203      PT SHORT TERM GOAL #1   Title  Be independent with initial home exercise program for self-management of symptoms.    Baseline  Initial HEP to be provided at visit 2 (08/04/2018); pt provided with initial HEP, delayed due to lack of tolerance to activity (08/16/2018);    Time  2    Period  Weeks    Status  Partially Met    Target Date  08/18/18        PT Long Term Goals - 08/04/18 1910      PT LONG TERM GOAL #1   Title  Be independent with a long-term home exercise program for self-management of symptoms.    Baseline  Initial HEP to provided at visit 2 (08/04/2018);    Time  6    Period  Weeks    Status  New    Target Date  09/15/18      PT LONG TERM GOAL #2   Title  Demonstrate improved FOTO score by 10 units to demonstrate improvement in overall condition and  self-reported functional ability.    Baseline  FOTO = 37 (08/04/2018);    Time  6    Period  Weeks    Status  New    Target Date  09/15/18      PT LONG TERM GOAL #3   Title  Be able to squat to chair height with proper form without limitation due to current condition in order to improve ability to lift and complete transfers during usual and work activities.    Baseline  see objective exam (08/04/2018);    Time  6  Period  Weeks    Status  New    Target Date  09/15/18      PT LONG TERM GOAL #4   Title  Patient will demonstrate B UE and LE strength 4+/5 or higher to demonstrate functional strength for improved gait, increased standing tolerance, reaching, lifting, carrying and work activities.    Baseline  see objective exam (08/04/2018);    Time  6    Period  Weeks    Status  New    Target Date  09/15/18      PT LONG TERM GOAL #5   Title  Be able to demonstrate floor to waist lift with proper form without limitation due to current condition in order to improve ability to lift during usual and work activities.    Baseline  see objective exam (08/04/2018);    Time  6    Period  Weeks    Status  New    Target Date  09/15/18      Additional Long Term Goals   Additional Long Term Goals  Yes      PT LONG TERM GOAL #6   Title  Patient will be able to return to full time employment without limitaiton due to current condition.    Baseline  currently unable to tolerate work activities - see subjective and objective exam (08/04/2018);    Time  6    Period  Weeks    Status  New    Target Date  09/15/18            Plan - 08/23/18 1153    Clinical Impression Statement  Address mobility deficits with patient today focusing on improving low back/hip extension and rotation. With performing lumbar extension in standing patient reports increase in pain during the flexed position in standing and decreased pain in the extended position, although he was unable to achieve significant lumbar  extension in standing. Patient demonstrates poor overll mobility with heavy guarding and is highly limited by pain. Patient demonstrates slight increase in pain at the end of the session (7-8/10) compared to a 6/10. Patient will benefit from further skilled therapy to return to prior level of function.    Personal Factors and Comorbidities  Time since onset of injury/illness/exacerbation;Past/Current Experience;Comorbidity 3+    Comorbidities  HTN, hx of hernia repair, migraines, degenerative disc disease in cervical and lumbar spine, hyperlipidemia, at least 4 lumbar surgeries (last in September 2019 that included L3-4 posterior and interbody fusion and left partial facetectomy and left foraminotomy. Previous surgeries included interbody fusion at L4-5 and L5-S1), past cervical spine surgery (ACDF C6-7), multiple spinal injections, hx hernia repair x 2, hx of bilateral septoplasty and nasal turbinate reduction, hx of R knee surgeries for torn meniscus.    Examination-Activity Limitations  Bathing;Hygiene/Grooming;Squat;Stairs;Lift;Bed Mobility;Bend;Stand;Toileting;Carry;Transfers;Sit;Dressing    Examination-Participation Restrictions  Cleaning;Laundry;Shop;Volunteer;Community Activity;Yard Work;Driving;Interpersonal Relationship    Rehab Potential  Fair    PT Frequency  2x / week    PT Duration  6 weeks    PT Treatment/Interventions  ADLs/Self Care Home Management;Aquatic Therapy;Cryotherapy;Electrical Stimulation;Moist Heat;Therapeutic activities;Therapeutic exercise;Neuromuscular re-education;Patient/family education;Manual techniques;Passive range of motion;Dry needling;Joint Manipulations;Spinal Manipulations;Other (comment)    PT Next Visit Plan  graded funcitonal exercise as tolerated, pain control    PT Home Exercise Plan  Sciatic Nerve Flossing    Consulted and Agree with Plan of Care  Patient       Patient will benefit from skilled therapeutic intervention in order to improve the following  deficits and impairments:  Decreased endurance, Decreased mobility, Difficulty walking, Hypomobility, Increased muscle spasms, Impaired sensation, Decreased range of motion, Decreased scar mobility, Impaired perceived functional ability, Improper body mechanics, Decreased activity tolerance, Decreased strength, Impaired flexibility, Impaired UE functional use, Pain  Visit Diagnosis: 1. Chronic bilateral low back pain with bilateral sciatica   2. Paresthesia of skin   3. Muscle weakness (generalized)        Problem List Patient Active Problem List   Diagnosis Date Noted  . 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 Ruptured lumbar disc 10/09/2017  . Rhinitis medicamentosa 07/06/2015  . DDD (degenerative disc disease), lumbar 07/20/2014  . Hyperlipidemia 07/20/2014  . Hypertension 07/20/2014  . DDD (degenerative disc disease), cervical 07/20/2014  . Umbilical hernia 94/58/5929    Blythe Stanford, PT DPT 08/23/2018, 1:18 PM  [facility] [address] [place], [address] Phone: [phone]   Fax:  [phone]  Name: [name] MRN: [id number] Date of Birth: 1970-04-30

## 2018-08-23 NOTE — Telephone Encounter (Signed)
Message relayed to patient. Verbalized understanding. Patient asked who else in the office could write. Advised patient that no one in our office does long term disability. Patient then asked about Pain management. Again explained that we can send him else where, however, it would be out of network and patient would have to cover remaining cost that insurance will not pay for out of network services. Pt asked what cost are like for pain management. I did call [facility] and was told initial OV was $125 cash, follow ups were ~$300, and procedures could be upwards of $1000. Patient must be current on bills or will not be able to schedule next visit. Attempted to call patient back and notify him. VM box was not set up.

## 2018-08-25 ENCOUNTER — Other Ambulatory Visit: Payer: Self-pay

## 2018-08-25 ENCOUNTER — Ambulatory Visit: Payer: BLUE CROSS/BLUE SHIELD | Admitting: Physical Therapy

## 2018-08-25 DIAGNOSIS — M5442 Lumbago with sciatica, left side: Secondary | ICD-10-CM | POA: Diagnosis not present

## 2018-08-25 DIAGNOSIS — R202 Paresthesia of skin: Secondary | ICD-10-CM

## 2018-08-25 DIAGNOSIS — M542 Cervicalgia: Secondary | ICD-10-CM

## 2018-08-25 DIAGNOSIS — G8929 Other chronic pain: Secondary | ICD-10-CM

## 2018-08-25 DIAGNOSIS — R262 Difficulty in walking, not elsewhere classified: Secondary | ICD-10-CM

## 2018-08-25 DIAGNOSIS — M6281 Muscle weakness (generalized): Secondary | ICD-10-CM

## 2018-08-25 NOTE — Therapy (Signed)
[facility] [address] [place], [address] Phone: [phone]   Fax:  [phone]  Physical Therapy Treatment  Patient Details  Name: [name] MRN: [id number] Date of Birth: 10/17/1970 Referring Provider (PT): Haywood Lasso   Encounter Date: 08/25/2018  PT End of Session - 08/25/18 1139    Visit Number  7    Number of Visits  12    Date for PT Re-Evaluation  09/15/18    Authorization Type  [employer] reporting period from 08/04/2018    Authorization Time Period  Current cert period: 06/10/2618 - 09/15/2018 (latest PN: IE 08/04/2018);    Authorization - Visit Number  7    Authorization - Number of Visits  10    PT Start Time  1030    PT Stop Time  1130    PT Time Calculation (min)  60 min    Activity Tolerance  Patient tolerated treatment well;Patient limited by pain    Behavior During Therapy  WFL for tasks assessed/performed       Past Medical History:  Diagnosis Date  . DDD (degenerative disc disease), lumbar   . Dental bridge present    permanant - top left  . Headache    migraines - pinched nerve in neck - no mvmt limitations  . Hernia July 3559   umbilical  . Hypertension     Past Surgical History:  Procedure Laterality Date  . BACK SURGERY  2003, 2007, 2009, 2012   Dr Glenna Fellows  . HERNIA REPAIR  [id number]   umbilical hernia  . KNEE SURGERY Right 1997, 2001   torn miniscus  . LUMBAR DISC SURGERY     X 3 with hardware  . NASAL TURBINATE REDUCTION Bilateral 10/26/2015   Procedure: TURBINATE REDUCTION/SUBMUCOSAL RESECTION;  Surgeon: Beverly Gust, MD;  Location: [facility];  Service: ENT;  Laterality: Bilateral;  . SEPTOPLASTY Bilateral 10/26/2015   Procedure: SEPTOPLASTY;  Surgeon: Beverly Gust, MD;  Location: [facility];  Service: ENT;  Laterality: Bilateral;    There were no vitals filed for this visit.  Subjective Assessment - 08/25/18 1033    Subjective  Patient  reports he is not feeling well today. He states he was feeling good until about 1pm yesterday and it got worse until 5pm. Started at mid back, went to low back and down to low back and down the back of both legs to the feet. Numbness and tingling in both legs to the feet. L leg worse than R. Could not sleep last night and had more cramps in the the L. Has not had cramps like this since before surgery. He tried heat without improvement. Cannot remember doing anything different yesterday. Walked around and went to grocery store. Biggest thing he got was 1/5 gallon bottles of tea (usually gets these). Feels stress level has been about the same. He states his diet has not changed a lot. PB and J and [place] and ham sandwiches since it has been so hot. Did not take any different medications or supplements. Patient reports he felt a bit sore during his last treatment session. Does not remember specific provocative exercise last session. He did not feel bad following. Patient feels that his pain is stemming from his thoracic spine is "out of place."    Pertinent History  See body of note for detailed history.    Limitations  Sitting;Lifting;Standing;Walking;House hold activities;Other (comment)    How long can you sit  comfortably?  Sitting time: 1.5-2 hours uncomfortable.    How long can you stand comfortably?  Standing time: 1.5-2 hours uncomfortable    How long can you walk comfortably?  Walking: about 5 min or so (but variable), half the store at [employer], very slow, uncomfortable.    Diagnostic tests  Imaging: MR Lumbar spine report 11/14/2018: "IMPRESSION: 1. Surgical changes from recent surgery with L3-4 posterior and interbody fusion and left partial facetectomy and left foraminotomy. No obvious complicating features such as hematoma. 2. Remote posterior and interbody fusion changes at L4-5 and L5-S1. 3. Normal appearance of L1-2 and L2-3." MR C-spine report 10/30/2016: "IMPRESSION: Mild spinal  stenosis and mild foraminal stenosis bilaterally C3-4 due to spurring unchanged. Progression of left foraminal disc protrusion at C5-6 with associated spurring. Expected compression left C6 nerve root. ACDF C6-7."    Patient Stated Goals  to return to PLOF and decrease pain; to be approved for disability funding until he is able to return to work    Currently in Pain?  Yes    Pain Score  10-Worst pain ever    Pain Location  Back   mid thoracic spine.   Pain Orientation  Right;Mid;Lower    Pain Descriptors / Indicators  Tingling    Pain Type  Chronic pain    Pain Radiating Towards  Low back "doesn't feel right," currently L LE tingling to toes.    Pain Onset  More than a month ago        TREATMENT Therapeutic exercise: to centralize symptoms and improve ROM, strength, muscular endurance, and activity tolerance required for successful completion of functional activities.  - education about imaging, nerve conduction testing, review of current symptoms. -lower trunk rotations 2 mins with PT assist to guide motion, instructed in breathing each rotation. -hooklying figure 4/piriformis stretch 3x30sec holds bilaterally (L side required more time to ease into position for first rep). PROM clinician assist. increased restriction on L compared to R, pt with complaints of R lateral hip pain while stretching each side(B feet paresthesia reported with stretching) -Gentle hip IR/ER PROM x10 bilaterally with mild OP. pt reported end range discomfort with these motions, no worse following. Feet tingling.  - supine SLR PROM stretch with clinician assistance, 2 min each side intermittant holds 10-30 seconds at a time.    Manual therapy: to reduce pain and tissue tension, improve range of motion, neuromodulation, in order to promote improved ability to complete functional activities. - seated distraction  HVLA joint mobilization grade V with multiple cavitations targeting thoracic spine after patient reported  history of relief with similar technique. Patient reported improved symptoms following.  - prone with one pillow under hips:   CPA joint mobilizations grade II-IV at mid thoracic spine over painful region (ERP, no worse each rep).   STM with trigger point release to thoracic and lumbar paraspinals and surrounding musculature, bilateral glutes focusing on tender fibers of upper glutes. Deeper techniques used at R thoracic paraspinals where significant tension and pain was palpable, superficial techniques across thoracic and lumbar spine, moderately deep STM to glutes. Noted for L glute tenderness greater than R.   Discussed possible dry needing at future sessions after patient asked if acupuncture is performed at this clinic.    Performed exercises and manual to improve mobility and the lumbar spine and decrease muscular guarding throughout.   PT Education - 08/25/18 1149    Education Details  Exercise purpose/form. Self management techniques. complexity and physiology of pain,  nerve conduction techniques, benefits and limitations of imaging.    Person(s) Educated  Patient    Methods  Explanation;Demonstration;Tactile cues;Verbal cues    Comprehension  Verbalized understanding;Returned demonstration;Verbal cues required;Tactile cues required       PT Short Term Goals - 08/16/18 1203      PT SHORT TERM GOAL #1   Title  Be independent with initial home exercise program for self-management of symptoms.    Baseline  Initial HEP to be provided at visit 2 (08/04/2018); pt provided with initial HEP, delayed due to lack of tolerance to activity (08/16/2018);    Time  2    Period  Weeks    Status  Partially Met    Target Date  08/18/18        PT Long Term Goals - 08/04/18 1910      PT LONG TERM GOAL #1   Title  Be independent with a long-term home exercise program for self-management of symptoms.    Baseline  Initial HEP to provided at visit 2 (08/04/2018);    Time  6    Period  Weeks     Status  New    Target Date  09/15/18      PT LONG TERM GOAL #2   Title  Demonstrate improved FOTO score by 10 units to demonstrate improvement in overall condition and self-reported functional ability.    Baseline  FOTO = 37 (08/04/2018);    Time  6    Period  Weeks    Status  New    Target Date  09/15/18      PT LONG TERM GOAL #3   Title  Be able to squat to chair height with proper form without limitation due to current condition in order to improve ability to lift and complete transfers during usual and work activities.    Baseline  see objective exam (08/04/2018);    Time  6    Period  Weeks    Status  New    Target Date  09/15/18      PT LONG TERM GOAL #4   Title  Patient will demonstrate B UE and LE strength 4+/5 or higher to demonstrate functional strength for improved gait, increased standing tolerance, reaching, lifting, carrying and work activities.    Baseline  see objective exam (08/04/2018);    Time  6    Period  Weeks    Status  New    Target Date  09/15/18      PT LONG TERM GOAL #5   Title  Be able to demonstrate floor to waist lift with proper form without limitation due to current condition in order to improve ability to lift during usual and work activities.    Baseline  see objective exam (08/04/2018);    Time  6    Period  Weeks    Status  New    Target Date  09/15/18      Additional Long Term Goals   Additional Long Term Goals  Yes      PT LONG TERM GOAL #6   Title  Patient will be able to return to full time employment without limitaiton due to current condition.    Baseline  currently unable to tolerate work activities - see subjective and objective exam (08/04/2018);    Time  6    Period  Weeks    Status  New    Target Date  09/15/18  Plan - 08/25/18 1148    Clinical Impression Statement  Patient tolerated treatment well but with difficulty due to high level of pain at this session. He reported reductions in thoracic spine pain with HVLA  thrust and STM. Reported decreased pain in thoracic spine, lumbar spine, and R leg with overall pain level improving from 10/10 to 7-8/10 by end of session. Discussed some aspects of pain science and the complexity of pain throughout session. Patient states he is willing to try anything that will help improve his pain. Patient able to complete bed mobility and transfers independently with slow and guarded posture related to pain. Focus of today's treatment was pain relief due to elevated pain levels. Recommend return to some graded exercises next session as well as considering manual therapy for pain relief. Patient would benefit from continued physical therapy to address remaining impairments and functional limitations to work towards stated goals and return to PLOF or maximal functional independence.    Personal Factors and Comorbidities  Time since onset of injury/illness/exacerbation;Past/Current Experience;Comorbidity 3+    Comorbidities  HTN, hx of hernia repair, migraines, degenerative disc disease in cervical and lumbar spine, hyperlipidemia, at least 4 lumbar surgeries (last in September 2019 that included L3-4 posterior and interbody fusion and left partial facetectomy and left foraminotomy. Previous surgeries included interbody fusion at L4-5 and L5-S1), past cervical spine surgery (ACDF C6-7), multiple spinal injections, hx hernia repair x 2, hx of bilateral septoplasty and nasal turbinate reduction, hx of R knee surgeries for torn meniscus.    Examination-Activity Limitations  Bathing;Hygiene/Grooming;Squat;Stairs;Lift;Bed Mobility;Bend;Stand;Toileting;Carry;Transfers;Sit;Dressing    Examination-Participation Restrictions  Cleaning;Laundry;Shop;Volunteer;Community Activity;Yard Work;Driving;Interpersonal Relationship    Rehab Potential  Fair    PT Frequency  2x / week    PT Duration  6 weeks    PT Treatment/Interventions  ADLs/Self Care Home Management;Aquatic Therapy;Cryotherapy;Electrical  Stimulation;Moist Heat;Therapeutic activities;Therapeutic exercise;Neuromuscular re-education;Patient/family education;Manual techniques;Passive range of motion;Dry needling;Joint Manipulations;Spinal Manipulations;Other (comment)    PT Next Visit Plan  graded funcitonal exercise as tolerated, pain control, consider dry needling    PT Home Exercise Plan  Sciatic Nerve Flossing    Consulted and Agree with Plan of Care  Patient       Patient will benefit from skilled therapeutic intervention in order to improve the following deficits and impairments:  Decreased endurance, Decreased mobility, Difficulty walking, Hypomobility, Increased muscle spasms, Impaired sensation, Decreased range of motion, Decreased scar mobility, Impaired perceived functional ability, Improper body mechanics, Decreased activity tolerance, Decreased strength, Impaired flexibility, Impaired UE functional use, Pain  Visit Diagnosis: 1. Chronic bilateral low back pain with bilateral sciatica   2. Paresthesia of skin   3. Muscle weakness (generalized)   4. Difficulty in walking, not elsewhere classified   5. Cervicalgia        Problem List Patient Active Problem List   Diagnosis Date Noted  . 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 Ruptured lumbar disc 10/09/2017  . Rhinitis medicamentosa 07/06/2015  . DDD (degenerative disc disease), lumbar 07/20/2014  . Hyperlipidemia 07/20/2014  . Hypertension 07/20/2014  . DDD (degenerative disc disease), cervical 07/20/2014  . Umbilical hernia 16/11/9602   Everlean Alstrom. Graylon Good, PT, DPT 08/25/18, 11:50 AM  [facility] [address], [place], [address] Phone: [phone]   Fax:  [phone]  Name: [name] MRN: [id number] Date of Birth: 19-Jan-1971

## 2018-08-30 ENCOUNTER — Other Ambulatory Visit: Payer: Self-pay

## 2018-08-30 ENCOUNTER — Encounter: Payer: Self-pay | Admitting: Physical Therapy

## 2018-08-30 ENCOUNTER — Ambulatory Visit: Payer: BLUE CROSS/BLUE SHIELD | Admitting: Physical Therapy

## 2018-08-30 DIAGNOSIS — M542 Cervicalgia: Secondary | ICD-10-CM

## 2018-08-30 DIAGNOSIS — M5442 Lumbago with sciatica, left side: Secondary | ICD-10-CM | POA: Diagnosis not present

## 2018-08-30 DIAGNOSIS — M6281 Muscle weakness (generalized): Secondary | ICD-10-CM

## 2018-08-30 DIAGNOSIS — R202 Paresthesia of skin: Secondary | ICD-10-CM

## 2018-08-30 DIAGNOSIS — G8929 Other chronic pain: Secondary | ICD-10-CM

## 2018-08-30 DIAGNOSIS — R262 Difficulty in walking, not elsewhere classified: Secondary | ICD-10-CM

## 2018-08-30 NOTE — Therapy (Signed)
[facility] [address] [place], [address] Phone: [phone]   Fax:  [phone]  Physical Therapy Treatment  Patient Details  Name: [name] MRN: [id number] Date of Birth: 09-10-1970 Referring Provider (PT): Haywood Lasso   Encounter Date: 08/30/2018  PT End of Session - 08/30/18 1728    Visit Number  8    Number of Visits  12    Date for PT Re-Evaluation  09/15/18    Authorization Type  [employer] reporting period from 08/04/2018    Authorization Time Period  Current cert period: 09/03/8401 - 09/15/2018 (latest PN: IE 08/04/2018);    Authorization - Visit Number  8    Authorization - Number of Visits  10    PT Start Time  1037    PT Stop Time  1115    PT Time Calculation (min)  38 min    Activity Tolerance  Patient tolerated treatment well;Patient limited by pain    Behavior During Therapy  WFL for tasks assessed/performed       Past Medical History:  Diagnosis Date  . DDD (degenerative disc disease), lumbar   . Dental bridge present    permanant - top left  . Headache    migraines - pinched nerve in neck - no mvmt limitations  . Hernia July 7543   umbilical  . Hypertension     Past Surgical History:  Procedure Laterality Date  . BACK SURGERY  2003, 2007, 2009, 2012   Dr Glenna Fellows  . HERNIA REPAIR  [id number]   umbilical hernia  . KNEE SURGERY Right 1997, 2001   torn miniscus  . LUMBAR DISC SURGERY     X 3 with hardware  . NASAL TURBINATE REDUCTION Bilateral 10/26/2015   Procedure: TURBINATE REDUCTION/SUBMUCOSAL RESECTION;  Surgeon: Beverly Gust, MD;  Location: [facility];  Service: ENT;  Laterality: Bilateral;  . SEPTOPLASTY Bilateral 10/26/2015   Procedure: SEPTOPLASTY;  Surgeon: Beverly Gust, MD;  Location: [facility];  Service: ENT;  Laterality: Bilateral;    There were no vitals filed for this visit.  Subjective Assessment - 08/30/18 1039    Subjective  Patient  reports he has pain in neck mid-back and low back about 7/10. He states he thinks he is going to the chiropractor today and that he goes so much that he can usually get in within 15 min if he calls for an appointment. He states he felt better overall following last treatment sesion but ended up needing to go to bed when he got home. He has been spending at least half the day in bed each day since last session. He did get outside Sat to try to get something done, but didn't accomplish anything. He tried to [employer] the yard. He got part of the yard done but not all. He states his BUE are currently tingling and aching. HEP is going okay. Working on them some but "not as often as I should." He tries to work on them when he can. 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 Patient would like to get a copy of his initial eval from physical therapy.    Pertinent History  See body of note for detailed history.    Limitations  Sitting;Lifting;Standing;Walking;House hold activities;Other (comment)    How long can you sit comfortably?  Sitting time: 1.5-2 hours uncomfortable.    How long can you stand comfortably?  Standing time: 1.5-2 hours uncomfortable    How long can you walk comfortably?  Walking: about 5 min or so (but variable), half the store at [employer], very slow, uncomfortable.    Diagnostic tests  Imaging: MR Lumbar spine report 11/14/2018: "IMPRESSION: 1. Surgical changes from recent surgery with L3-4 posterior and interbody fusion and left partial facetectomy and left foraminotomy. No obvious complicating features such as hematoma. 2. Remote posterior and interbody fusion changes at L4-5 and L5-S1. 3. Normal appearance of L1-2 and L2-3." MR C-spine report 10/30/2016: "IMPRESSION: Mild spinal stenosis and mild foraminal stenosis bilaterally C3-4 due to spurring unchanged. Progression of left foraminal disc protrusion at C5-6 with associated spurring.  Expected compression left C6 nerve root. ACDF C6-7."    Patient Stated Goals  to return to PLOF and decrease pain; to be approved for disability funding until he is able to return to work    Currently in Pain?  Yes    Pain Score  7     Pain Location  Back   neck,  thoracic spine, lower back, B hands numb/tingling; B feet tingling.   Pain Orientation  Right;Left    Pain Descriptors / Indicators  Aching;Tingling;Pins and needles;Numbness   neck = like a pinched nerve, T-spine = dagger, lower back = constant muscle pain   Pain Onset  More than a month ago         TREATMENT Therapeutic exercise:to centralize symptoms and improve ROM, strength, muscular endurance, and activity tolerance required for successful completion of functional activities.  -hooklyingfigure 4/piriformis stretch 5x10sec holds bilaterally. PROM clinician assist.increased restriction on L compared to R, pt with complaints ofR lateralhip pain while stretching each side(B feet paresthesiacontinued with stretching) -gentle sciatic nerve floss x 15 each side with therapist assisting in keeping hip flexed to 90 degrees to allow improved relaxation.   Manual therapy: to reduce pain and tissue tension, improve range of motion, neuromodulation, in order to promote improved ability to complete functional activities.  - prone with two pillow under hips:   prone  HVLA joint mobilization grade V with multiple cavitations targeting thoracic spine after patient reported history of relief with similar technique. Patient reported improved symptoms following.   STM with trigger point release to thoracic and lumbar paraspinals and surrounding musculature. Deeper techniques used at R thoracic paraspinals where significant tension and pain was palpable, superficial techniques across thoracic and lumbar spine.   Performed exercises and manual to improve mobility and the lumbar spine and decrease muscular guarding throughout. No change  in discomfort in legs while laying prone.    PT Education - 08/30/18 1728    Education Details  Exercise purpose/form. Self management techniques    Person(s) Educated  Patient    Methods  Explanation;Demonstration;Tactile cues;Verbal cues    Comprehension  Verbalized understanding;Returned demonstration;Verbal cues required       PT Short Term Goals - 08/16/18 1203      PT SHORT TERM GOAL #1   Title  Be independent with initial home exercise program for self-management of symptoms.    Baseline  Initial HEP to be provided at visit 2 (08/04/2018); pt provided with initial HEP, delayed due to lack of tolerance to activity (08/16/2018);    Time  2    Period  Weeks    Status  Partially Met    Target Date  08/18/18        PT Long Term Goals - 08/04/18 1910      PT LONG  TERM GOAL #1   Title  Be independent with a long-term home exercise program for self-management of symptoms.    Baseline  Initial HEP to provided at visit 2 (08/04/2018);    Time  6    Period  Weeks    Status  New    Target Date  09/15/18      PT LONG TERM GOAL #2   Title  Demonstrate improved FOTO score by 10 units to demonstrate improvement in overall condition and self-reported functional ability.    Baseline  FOTO = 37 (08/04/2018);    Time  6    Period  Weeks    Status  New    Target Date  09/15/18      PT LONG TERM GOAL #3   Title  Be able to squat to chair height with proper form without limitation due to current condition in order to improve ability to lift and complete transfers during usual and work activities.    Baseline  see objective exam (08/04/2018);    Time  6    Period  Weeks    Status  New    Target Date  09/15/18      PT LONG TERM GOAL #4   Title  Patient will demonstrate B UE and LE strength 4+/5 or higher to demonstrate functional strength for improved gait, increased standing tolerance, reaching, lifting, carrying and work activities.    Baseline  see objective exam (08/04/2018);    Time  6     Period  Weeks    Status  New    Target Date  09/15/18      PT LONG TERM GOAL #5   Title  Be able to demonstrate floor to waist lift with proper form without limitation due to current condition in order to improve ability to lift during usual and work activities.    Baseline  see objective exam (08/04/2018);    Time  6    Period  Weeks    Status  New    Target Date  09/15/18      Additional Long Term Goals   Additional Long Term Goals  Yes      PT LONG TERM GOAL #6   Title  Patient will be able to return to full time employment without limitaiton due to current condition.    Baseline  currently unable to tolerate work activities - see subjective and objective exam (08/04/2018);    Time  6    Period  Weeks    Status  New    Target Date  09/15/18            Plan - 08/30/18 1735    Clinical Impression Statement  Patient tolerated treatment well and reported a decrease of 2 points on the numeric pain rating scale following manual therapy. Continues to be severely limited functionally with high levels of pain and daily time in bed due to pain. Patient was unable to progress exercises today due to elevated pain but would benefit from further pain control and graded exercises to return to valued activities. Patient would benefit from continued physical therapy to address remaining impairments and functional limitations to work towards stated goals and return to PLOF or maximal functional independence.    Personal Factors and Comorbidities  Time since onset of injury/illness/exacerbation;Past/Current Experience;Comorbidity 3+    Comorbidities  HTN, hx of hernia repair, migraines, degenerative disc disease in cervical and lumbar spine, hyperlipidemia, at least 4 lumbar surgeries (last in  September 2019 that included L3-4 posterior and interbody fusion and left partial facetectomy and left foraminotomy. Previous surgeries included interbody fusion at L4-5 and L5-S1), past cervical spine surgery  (ACDF C6-7), multiple spinal injections, hx hernia repair x 2, hx of bilateral septoplasty and nasal turbinate reduction, hx of R knee surgeries for torn meniscus.    Examination-Activity Limitations  Bathing;Hygiene/Grooming;Squat;Stairs;Lift;Bed Mobility;Bend;Stand;Toileting;Carry;Transfers;Sit;Dressing    Examination-Participation Restrictions  Cleaning;Laundry;Shop;Volunteer;Community Activity;Yard Work;Driving;Interpersonal Relationship    Rehab Potential  Fair    PT Frequency  2x / week    PT Duration  6 weeks    PT Treatment/Interventions  ADLs/Self Care Home Management;Aquatic Therapy;Cryotherapy;Electrical Stimulation;Moist Heat;Therapeutic activities;Therapeutic exercise;Neuromuscular re-education;Patient/family education;Manual techniques;Passive range of motion;Dry needling;Joint Manipulations;Spinal Manipulations;Other (comment)    PT Next Visit Plan  graded funcitonal exercise as tolerated, pain control, consider dry needling    PT Home Exercise Plan  Sciatic Nerve Flossing    Consulted and Agree with Plan of Care  Patient       Patient will benefit from skilled therapeutic intervention in order to improve the following deficits and impairments:  Decreased endurance, Decreased mobility, Difficulty walking, Hypomobility, Increased muscle spasms, Impaired sensation, Decreased range of motion, Decreased scar mobility, Impaired perceived functional ability, Improper body mechanics, Decreased activity tolerance, Decreased strength, Impaired flexibility, Impaired UE functional use, Pain  Visit Diagnosis: 1. Chronic bilateral low back pain with bilateral sciatica   2. Paresthesia of skin   3. Muscle weakness (generalized)   4. Difficulty in walking, not elsewhere classified   5. Cervicalgia        Problem List Patient Active Problem List   Diagnosis Date Noted  . 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 Ruptured lumbar disc 10/09/2017  . Rhinitis medicamentosa 07/06/2015  .  DDD (degenerative disc disease), lumbar 07/20/2014  . Hyperlipidemia 07/20/2014  . Hypertension 07/20/2014  . DDD (degenerative disc disease), cervical 07/20/2014  . Umbilical hernia 91/55/0271    [name], PT, DPT 08/30/18, 5:37 PM  [facility] [address] [place], [address] Phone: [phone]   Fax:  [phone]  Name: [name] MRN: [id number] Date of Birth: [birth year]

## 2018-09-01 ENCOUNTER — Encounter: Payer: Self-pay | Admitting: Physical Therapy

## 2018-09-01 ENCOUNTER — Ambulatory Visit: Payer: BLUE CROSS/BLUE SHIELD | Admitting: Physical Therapy

## 2018-09-01 ENCOUNTER — Other Ambulatory Visit: Payer: Self-pay

## 2018-09-01 DIAGNOSIS — M5442 Lumbago with sciatica, left side: Secondary | ICD-10-CM | POA: Diagnosis not present

## 2018-09-01 DIAGNOSIS — M6281 Muscle weakness (generalized): Secondary | ICD-10-CM

## 2018-09-01 DIAGNOSIS — R262 Difficulty in walking, not elsewhere classified: Secondary | ICD-10-CM

## 2018-09-01 DIAGNOSIS — R202 Paresthesia of skin: Secondary | ICD-10-CM

## 2018-09-01 DIAGNOSIS — M542 Cervicalgia: Secondary | ICD-10-CM

## 2018-09-01 DIAGNOSIS — G8929 Other chronic pain: Secondary | ICD-10-CM

## 2018-09-01 NOTE — Therapy (Signed)
[facility] [address] [place], [address] Phone: [phone]   Fax:  [phone]  Physical Therapy Treatment  Patient Details  Name: [name] MRN: [id number] Date of Birth: 1970-11-16 Referring Provider (PT): Haywood Lasso   Encounter Date: 09/01/2018  PT End of Session - 09/01/18 1140    Visit Number  9    Number of Visits  12    Date for PT Re-Evaluation  09/15/18    Authorization Type  [employer] reporting period from 08/04/2018    Authorization Time Period  Current cert period: 04/05/2023 - 09/15/2018 (latest PN: IE 08/04/2018);    Authorization - Visit Number  9    Authorization - Number of Visits  10    PT Start Time  4270    PT Stop Time  1120    PT Time Calculation (min)  45 min    Activity Tolerance  Patient tolerated treatment well;Patient limited by pain    Behavior During Therapy  [facility] for tasks assessed/performed;Flat affect       Past Medical History:  Diagnosis Date  . DDD (degenerative disc disease), lumbar   . Dental bridge present    permanant - top left  . Headache    migraines - pinched nerve in neck - no mvmt limitations  . Hernia July 6237   umbilical  . Hypertension     Past Surgical History:  Procedure Laterality Date  . BACK SURGERY  2003, 2007, 2009, 2012   Dr Glenna Fellows  . HERNIA REPAIR  [id number]   umbilical hernia  . KNEE SURGERY Right 1997, 2001   torn miniscus  . LUMBAR DISC SURGERY     X 3 with hardware  . NASAL TURBINATE REDUCTION Bilateral 10/26/2015   Procedure: TURBINATE REDUCTION/SUBMUCOSAL RESECTION;  Surgeon: Beverly Gust, MD;  Location: [facility];  Service: ENT;  Laterality: Bilateral;  . SEPTOPLASTY Bilateral 10/26/2015   Procedure: SEPTOPLASTY;  Surgeon: Beverly Gust, MD;  Location: [place];  Service: ENT;  Laterality: Bilateral;    There were no vitals filed for this visit.  Subjective Assessment - 09/01/18 1136     Subjective  Patient reports 6/10 pain upon arrival mostly in the low back. He reports both feet are tingling but not numb and he has numbness in B UEs that is constant. He states he felt mildly better following last treatment session and went to the chiropractor shortly after, which gave him further relief for aout 2 hours, then spent most of the rest of the evening that day in bed. He also reports staying in the bed for aout 4 hours yesterday due to sympotms. States his thoracic spine feels a bit better today than when he came to last session. Reports he has no questsions about the PT eval note he obtained last session.    Pertinent History  See body of note for detailed history.    Limitations  Sitting;Lifting;Standing;Walking;House hold activities;Other (comment)    How long can you sit comfortably?  Sitting time: 1.5-2 hours uncomfortable.    How long can you stand comfortably?  Standing time: 1.5-2 hours uncomfortable    How long can you walk comfortably?  Walking: about 5 min or so (but variable), half the store at [employer], very slow, uncomfortable.    Diagnostic tests  Imaging: MR Lumbar spine report 11/14/2018: "IMPRESSION: 1. Surgical changes from recent surgery with L3-4 posterior and interbody fusion and left partial facetectomy and  left foraminotomy. No obvious complicating features such as hematoma. 2. Remote posterior and interbody fusion changes at L4-5 and L5-S1. 3. Normal appearance of L1-2 and L2-3." MR C-spine report 10/30/2016: "IMPRESSION: Mild spinal stenosis and mild foraminal stenosis bilaterally C3-4 due to spurring unchanged. Progression of left foraminal disc protrusion at C5-6 with associated spurring. Expected compression left C6 nerve root. ACDF C6-7."    Patient Stated Goals  to return to PLOF and decrease pain; to be approved for disability funding until he is able to return to work    Currently in Pain?  Yes    Pain Score  6     Pain Location  Back    concentrated in low back, also neck and thoracic spine, B hands numb, B feet tingling   Pain Orientation  Right;Left;Posterior    Pain Descriptors / Indicators  Aching;Tingling;Numbness;Pins and needles    Pain Onset  More than a month ago    Pain Frequency  Constant         TREATMENT Therapeutic exercise:to centralize symptoms and improve ROM, strength, muscular endurance, and activity tolerance required for successful completion of functional activities. - NuStep level 1 using bilateral lower extremities only. Moist heat applied to lumbar spine for increased comfort. Seat setting 10. For improved extremity mobility, muscular endurance, and activity tolerance; and to induce the analgesic effect of aerobic exercise, stimulate improved joint nutrition, and prepare body structures and systems for following interventions. x 10  Minutes (one lap). - ambulated 50 feet x 2 with SPC to assess response to nu-step and dry needling.  -supine clinician assisted AAROM sciatic nerve floss x 10 each side with slider technique to decreased neural irritability. Pt reported abolishment of BLE tingling.   Manual therapy:to reduce pain and tissue tension, improve range of motion, neuromodulation, in order to promote improved ability to complete functional activities. - prone with one pillow under hips:   STM to thoracic and lumbar paraspinals and surrounding musculature to assess for tightness and pain in preporation for dry needling.    Below manual and Trigger Point Dry Needling completed by Ramond Dial, PT, DPT who is certified in dry needling.   [facility] Adult PT Treatment/Exercise - 09/01/18 0001      Manual Therapy   Manual Therapy  Myofascial release    Myofascial Release  Rt thoracolumbar iliocostalis, Rt paraspinals, Right thoracic multifidus   5 minutes total      Trigger Point Dry Needling - 09/01/18 0001    Consent Given?  Yes    Education Handout Provided  Yes    Muscles Treated  Back/Hip  Lumbar multifidi;Erector spinae;Quadratus lumborum    Erector spinae Response  Palpable increased muscle length;Twitch response elicited   Right Q7-Y1 laterally most and attachment at Rib 10   Lumbar multifidi Response  --   T9-T12 Right   Quadratus Lumborum Response  --   unable to attempt, pt hyperalgesia, not tolerating        Patient response to treatment:  Patient tolerated treatment well but continued to be limited by pain and paresthesia. After completing NuStep activity he reported both legs went numb about 3 minutes into the nustep. He was motivated to complete one lap on the computer, which took 10 minutes. He reported continued numbness of bilateral legs while in prone during manual therapy and dry needling. Patient reported feeling better following dry needling but also reported increase in numeric pain rating from 6/10 to 7/10. LE numbness was relieved with hooklying nerve  flossing and patient reported no numbness in the legs upon leaving. He continued to have B UE numbness throughout the session. Patient continues to be very limited in activity tolerance and shows significant stiffness, pain, and muscle tightness throughout the lumbar spine and posterior abdominal muscles. Patient would benefit from continued physical therapy to address remaining impairments and functional limitations to work towards stated goals and return to PLOF or maximal functional independence.     PT Education - 09/01/18 1140    Education Details  Exercise purpose/form. Self management techniques. Information about dry needling and expected response.    Person(s) Educated  Patient    Methods  Demonstration;Explanation;Verbal cues;Handout    Comprehension  Verbalized understanding;Verbal cues required       PT Short Term Goals - 08/16/18 1203      PT SHORT TERM GOAL #1   Title  Be independent with initial home exercise program for self-management of symptoms.    Baseline  Initial HEP to be  provided at visit 2 (08/04/2018); pt provided with initial HEP, delayed due to lack of tolerance to activity (08/16/2018);    Time  2    Period  Weeks    Status  Partially Met    Target Date  08/18/18        PT Long Term Goals - 08/04/18 1910      PT LONG TERM GOAL #1   Title  Be independent with a long-term home exercise program for self-management of symptoms.    Baseline  Initial HEP to provided at visit 2 (08/04/2018);    Time  6    Period  Weeks    Status  New    Target Date  09/15/18      PT LONG TERM GOAL #2   Title  Demonstrate improved FOTO score by 10 units to demonstrate improvement in overall condition and self-reported functional ability.    Baseline  FOTO = 37 (08/04/2018);    Time  6    Period  Weeks    Status  New    Target Date  09/15/18      PT LONG TERM GOAL #3   Title  Be able to squat to chair height with proper form without limitation due to current condition in order to improve ability to lift and complete transfers during usual and work activities.    Baseline  see objective exam (08/04/2018);    Time  6    Period  Weeks    Status  New    Target Date  09/15/18      PT LONG TERM GOAL #4   Title  Patient will demonstrate B UE and LE strength 4+/5 or higher to demonstrate functional strength for improved gait, increased standing tolerance, reaching, lifting, carrying and work activities.    Baseline  see objective exam (08/04/2018);    Time  6    Period  Weeks    Status  New    Target Date  09/15/18      PT LONG TERM GOAL #5   Title  Be able to demonstrate floor to waist lift with proper form without limitation due to current condition in order to improve ability to lift during usual and work activities.    Baseline  see objective exam (08/04/2018);    Time  6    Period  Weeks    Status  New    Target Date  09/15/18      Additional Long Term Goals  Additional Long Term Goals  Yes      PT LONG TERM GOAL #6   Title  Patient will be able to return to full  time employment without limitaiton due to current condition.    Baseline  currently unable to tolerate work activities - see subjective and objective exam (08/04/2018);    Time  6    Period  Weeks    Status  New    Target Date  09/15/18            Plan - 09/01/18 1141    Clinical Impression Statement  Patient tolerated treatment well but continued to be limited by pain and paresthesia. After completing NuStep activity he reported both legs went numb about 3 minutes into the nustep. He was motivated to complete one lap on the computer, which took 10 minutes. He reported continued numbness of bilateral legs while in prone during manual therapy and dry needling. Patient reported feeling better following dry needling but also reported increase in numeric pain rating from 6/10 to 7/10. LE numbness was relieved with hooklying nerve flossing and patient reported no numbness in the legs upon leaving. He continued to have B UE numbness throughout the session. Patient continues to be very limited in activity tolerance and shows significant stiffness, pain, and muscle tightness throughout the lumbar spine and posterior abdominal muscles. Patient would benefit from continued physical therapy to address remaining impairments and functional limitations to work towards stated goals and return to PLOF or maximal functional independence.    Personal Factors and Comorbidities  Time since onset of injury/illness/exacerbation;Past/Current Experience;Comorbidity 3+    Comorbidities  HTN, hx of hernia repair, migraines, degenerative disc disease in cervical and lumbar spine, hyperlipidemia, at least 4 lumbar surgeries (last in September 2019 that included L3-4 posterior and interbody fusion and left partial facetectomy and left foraminotomy. Previous surgeries included interbody fusion at L4-5 and L5-S1), past cervical spine surgery (ACDF C6-7), multiple spinal injections, hx hernia repair x 2, hx of bilateral septoplasty  and nasal turbinate reduction, hx of R knee surgeries for torn meniscus.    Examination-Activity Limitations  Bathing;Hygiene/Grooming;Squat;Stairs;Lift;Bed Mobility;Bend;Stand;Toileting;Carry;Transfers;Sit;Dressing    Examination-Participation Restrictions  Cleaning;Laundry;Shop;Volunteer;Community Activity;Yard Work;Driving;Interpersonal Relationship    Rehab Potential  Fair    PT Frequency  2x / week    PT Duration  6 weeks    PT Treatment/Interventions  ADLs/Self Care Home Management;Aquatic Therapy;Cryotherapy;Electrical Stimulation;Moist Heat;Therapeutic activities;Therapeutic exercise;Neuromuscular re-education;Patient/family education;Manual techniques;Passive range of motion;Dry needling;Joint Manipulations;Spinal Manipulations;Other (comment)    PT Next Visit Plan  graded funcitonal exercise as tolerated, pain control; further assess superior glutes    PT Home Exercise Plan  Sciatic Nerve Flossing    Consulted and Agree with Plan of Care  Patient       Patient will benefit from skilled therapeutic intervention in order to improve the following deficits and impairments:  Decreased endurance, Decreased mobility, Difficulty walking, Hypomobility, Increased muscle spasms, Impaired sensation, Decreased range of motion, Decreased scar mobility, Impaired perceived functional ability, Improper body mechanics, Decreased activity tolerance, Decreased strength, Impaired flexibility, Impaired UE functional use, Pain  Visit Diagnosis: 1. Chronic bilateral low back pain with bilateral sciatica   2. Paresthesia of skin   3. Muscle weakness (generalized)   4. Difficulty in walking, not elsewhere classified   5. Cervicalgia        Problem List Patient Active Problem List   Diagnosis Date Noted  . 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 Ruptured lumbar disc 10/09/2017  . Rhinitis medicamentosa  07/06/2015  . DDD (degenerative disc disease), lumbar 07/20/2014  . Hyperlipidemia 07/20/2014   . Hypertension 07/20/2014  . DDD (degenerative disc disease), cervical 07/20/2014  . Umbilical hernia 54/62/7035    Everlean Alstrom. Graylon Good, PT, DPT 09/01/18, 11:45 AM  [facility] [address], [place], [address] Phone: [phone]   Fax:  [phone]  Name: [name] MRN: [id number] Date of Birth: 1970/12/15

## 2018-09-01 NOTE — Patient Instructions (Signed)

## 2018-09-06 ENCOUNTER — Other Ambulatory Visit: Payer: Self-pay

## 2018-09-06 ENCOUNTER — Ambulatory Visit: Payer: BLUE CROSS/BLUE SHIELD | Attending: Family Medicine | Admitting: Physical Therapy

## 2018-09-06 ENCOUNTER — Encounter: Payer: Self-pay | Admitting: Physical Therapy

## 2018-09-06 DIAGNOSIS — M542 Cervicalgia: Secondary | ICD-10-CM | POA: Diagnosis present

## 2018-09-06 DIAGNOSIS — M5441 Lumbago with sciatica, right side: Secondary | ICD-10-CM | POA: Insufficient documentation

## 2018-09-06 DIAGNOSIS — G8929 Other chronic pain: Secondary | ICD-10-CM

## 2018-09-06 DIAGNOSIS — R202 Paresthesia of skin: Secondary | ICD-10-CM | POA: Diagnosis present

## 2018-09-06 DIAGNOSIS — M6281 Muscle weakness (generalized): Secondary | ICD-10-CM

## 2018-09-06 DIAGNOSIS — R262 Difficulty in walking, not elsewhere classified: Secondary | ICD-10-CM | POA: Insufficient documentation

## 2018-09-06 DIAGNOSIS — M5442 Lumbago with sciatica, left side: Secondary | ICD-10-CM | POA: Diagnosis present
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
## 2018-09-08 ENCOUNTER — Other Ambulatory Visit: Payer: Self-pay

## 2018-09-08 ENCOUNTER — Encounter: Payer: Self-pay | Admitting: Physical Therapy

## 2018-09-08 ENCOUNTER — Ambulatory Visit: Payer: BLUE CROSS/BLUE SHIELD | Admitting: Physical Therapy

## 2018-09-08 DIAGNOSIS — M6281 Muscle weakness (generalized): Secondary | ICD-10-CM

## 2018-09-08 DIAGNOSIS — R262 Difficulty in walking, not elsewhere classified: Secondary | ICD-10-CM

## 2018-09-08 DIAGNOSIS — M542 Cervicalgia: Secondary | ICD-10-CM

## 2018-09-08 DIAGNOSIS — M5442 Lumbago with sciatica, left side: Secondary | ICD-10-CM

## 2018-09-08 DIAGNOSIS — G8929 Other chronic pain: Secondary | ICD-10-CM

## 2018-09-08 DIAGNOSIS — R202 Paresthesia of skin: Secondary | ICD-10-CM

## 2018-09-08 NOTE — Therapy (Signed)
[facility] [address], [place], [address] Phone: [phone]   Fax:  [phone]  Physical Therapy Treatment  Patient Details  Name: [name] MRN: [id number] Date of Birth: [birth year] Referring Provider (PT): Haywood Lasso   Encounter Date: 09/08/2018  PT End of Session - 09/08/18 1208    Visit Number  11    Number of Visits  26    Date for PT Re-Evaluation  11/01/18    Authorization Type  [employer] reporting period from 11/01/2018 (new reporting period from 09/06/2018)    Authorization Time Period  Current cert period: 0/08/6224  (latest PN: 09/06/2018);    Authorization - Visit Number  1    Authorization - Number of Visits  10    PT Start Time  1033    PT Stop Time  1115    PT Time Calculation (min)  42 min    Activity Tolerance  Patient tolerated treatment well;Patient limited by pain    Behavior During Therapy  WFL for tasks assessed/performed;Flat affect       Past Medical History:  Diagnosis Date  . DDD (degenerative disc disease), lumbar   . Dental bridge present    permanant - top left  . Headache    migraines - pinched nerve in neck - no mvmt limitations  . Hernia July 3335   umbilical  . Hypertension     Past Surgical History:  Procedure Laterality Date  . BACK SURGERY  2003, 2007, 2009, 2012   Dr Glenna Fellows  . HERNIA REPAIR  [id number]   umbilical hernia  . KNEE SURGERY Right 1997, 2001   torn miniscus  . LUMBAR DISC SURGERY     X 3 with hardware  . NASAL TURBINATE REDUCTION Bilateral 10/26/2015   Procedure: TURBINATE REDUCTION/SUBMUCOSAL RESECTION;  Surgeon: Beverly Gust, MD;  Location: [facility];  Service: ENT;  Laterality: Bilateral;  . SEPTOPLASTY Bilateral 10/26/2015   Procedure: SEPTOPLASTY;  Surgeon: Beverly Gust, MD;  Location: [facility];  Service: ENT;  Laterality: Bilateral;    There were no vitals filed for this visit.  Subjective Assessment -  09/08/18 1035    Subjective  Patient report he has a migraine today and feels it comes from his neck. It usually doesn't ease off until his neck gets popped by the chiroprator. 7/10 pain in his low back and neck. Legs feel numbs but he can feel pressure from the floor. entire L UE hurts after turning a screw driver yesterday and feeling a pop in the L wrist. R arm feels a bit numb, L arm a bit numb but hard to tell due to pain. Felt pretty good following last treatment session. Headache is 10-12/10. Is a bit sensitive to light but can live with it. Reports high tolerance for pain.    Pertinent History  See body of note for detailed history.    Limitations  Sitting;Lifting;Standing;Walking;House hold activities;Other (comment)    How long can you sit comfortably?  Sitting time: 1.5-2 hours uncomfortable.    How long can you stand comfortably?  Standing time: 1.5-2 hours uncomfortable    How long can you walk comfortably?  Walking: about 5 min or so (but variable), Half the grocery store pushing cart, uncomfortable.    Diagnostic tests  Imaging: MR Lumbar spine report 11/14/2018: "IMPRESSION: 1. Surgical changes from recent surgery with L3-4 posterior and interbody fusion and left partial facetectomy and left foraminotomy. No obvious complicating  features such as hematoma. 2. Remote posterior and interbody fusion changes at L4-5 and L5-S1. 3. Normal appearance of L1-2 and L2-3." MR C-spine report 10/30/2016: "IMPRESSION: Mild spinal stenosis and mild foraminal stenosis bilaterally C3-4 due to spurring unchanged. Progression of left foraminal disc protrusion at C5-6 with associated spurring. Expected compression left C6 nerve root. ACDF C6-7."    Patient Stated Goals  to return to PLOF and decrease pain; to be approved for disability funding until he is able to return to work. Legs buckle once or twice a day.    Pain Score  7     Pain Location  Back    Pain Type  Chronic pain    Pain Onset  More than a month  ago    Pain Frequency  Constant         TREATMENT Extensive history of spinal surgeries; ACDF and lumbar fusions and laminectomies. Denies surgery in thoracic spine.   Therapeutic exercise:to centralize symptoms and improve ROM, strength, muscular endurance, and activity tolerance required for successful completion of functional activities. - Ambulated around the room to assess and work towards improved activity tolerance. For improved lower extremity mobility, muscular endurance, and weightbearing activity tolerance; and to induce the analgesic effect of aerobic exercise, stimulate improved joint nutrition, and prepare body structures and systems for following interventions. Attempted to ambulate 6 minutes but needed to stop at 3 min 45 seconds due to increasing pain on the entire L side in arm, neck, back, and leg. Feels his left leg is increasingly weak and going to give out on him. Able to ambulate 700 feet.   - assumed hooklying position without pillow under head.  -gentle sciatic nerve floss x 10 each side with therapist assisting in keeping hip flexed to 90 degrees to allow improved relaxation. No improvement in pain.  - added pillow under head to provide slight flexion. No change in pain and pt removed after  2 min due to improved comfort without pillow.  - positioned in prone with two pillows under hips and head in flat cradle for dry needing. Patient reports decreased leg numbness while in this position during needling (dry needling - see below).   - pt sat up and stated he started feeling "heavy" pins and needles and tingling while lying prone and that it was improving while seated. States dry needling helped, but struggled to describe how. Noted that it helped things relax.  -hooklyingfigure 4/piriformis stretch 3x30sec holds R only. PROM clinician assist.Pt continues to feel it more in front of hip than back. After three stretches pt reported L arm and neck very uncomfortable and  requested to sit up. Asked about decompression therapy and stated he has had relief with traction like procedure. Educated about contraindication for mechanical traction with spinal fusion but offered manual traction.  (manual therapy - see below)  Manual therapy:to reduce pain and tissue tension, improve range of motion, neuromodulation, in order to promote improved ability to complete functional activities. - hooklying position, no pillow per pt preference:  Gentle STM to posterior cervical musculature, focusing on sub-occipital region to help relax for manual traction. Noted for tender point just posterior to L mastoid process.   Gentle manual traction intermittent with intermittent STM (see above). Patient reported it decreased paresthesia in both arms and legs (reported at one point that numbness and tingling is absent) and that it felt good. Noted increased L arm paresthesia when clinician tucked his chin slightly during traction, that was relieved with  increased upper cervical extension. Pt states he positions himself with upper cervical extension for comfort at times.   Modalities: to decrease pain and muscular spasms Dry Needling: (2) .25 x 61m needles placed along the L UT with patient positioned in prone. Patient verbally consents to treatment, educated on risks and benefits of treatment. Dry needling performed by WBlythe Stanford PT, DPT and certified in trigger point dry needling.      PT Education - 09/08/18 1209    Education Details  central sensitization, options for treatment and involvement of cervical spine, mechanical vs central contributors to pain    Person(s) Educated  Patient    Methods  Explanation;Tactile cues    Comprehension  Verbalized understanding       PT Short Term Goals - 09/06/18 1054      PT SHORT TERM GOAL #1   Title  Be independent with initial home exercise program for self-management of symptoms.    Baseline  Initial HEP to be provided at visit 2  (08/04/2018); pt provided with initial HEP, delayed due to lack of tolerance to activity (08/16/2018); pt reports his participation is "hit or miss" right now (09/06/2018);    Time  2    Period  Weeks    Status  Partially Met    Target Date  08/18/18        PT Long Term Goals - 09/06/18 1054      PT LONG TERM GOAL #1   Title  Be independent with a long-term home exercise program for self-management of symptoms.    Baseline  Initial HEP to provided at visit 2 (08/04/2018); pt reports his participation is "hit or miss" right now (09/06/2018);    Time  8    Period  Weeks    Status  Partially Met    Target Date  11/01/18      PT LONG TERM GOAL #2   Title  Demonstrate improved FOTO score by 10 units to demonstrate improvement in overall condition and self-reported functional ability.    Baseline  FOTO = 37 (08/04/2018); FOTO = 38 (09/06/2018);    Time  8    Period  Weeks    Status  Partially Met    Target Date  11/01/18      PT LONG TERM GOAL #3   Title  Be able to squat to chair height with proper form without limitation due to current condition in order to improve ability to lift and complete transfers during usual and work activities.    Baseline  see objective exam (08/04/2018); improving but still very limited see objective exam (09/06/2018);    Time  8    Period  Weeks    Status  Partially Met    Target Date  11/01/18      PT LONG TERM GOAL #4   Title  Patient will demonstrate B UE and LE strength 4+/5 or higher to demonstrate functional strength for improved gait, increased standing tolerance, reaching, lifting, carrying and work activities.    Baseline  see objective exam (08/04/2018); improving but still limited, see objective exam (09/06/2018);    Time  8    Period  Weeks    Status  Partially Met    Target Date  11/01/18      PT LONG TERM GOAL #5   Title  Be able to demonstrate floor to waist lift with proper form without limitation due to current condition in order to improve ability to  lift during  usual and work activities.    Baseline  see objective exam (08/04/2018); used similar technique to previously, squat is slightly deeper see objective exam (09/06/2018);    Time  8    Period  Weeks    Status  On-going    Target Date  11/01/18      PT LONG TERM GOAL #6   Title  Patient will be able to return to full time employment without limitaiton due to current condition.    Baseline  currently unable to tolerate work activities - see subjective and objective exam (08/04/2018); currently unable to tolerate work activities - see subjective and objective exam (09/06/2018);    Time  8    Period  Weeks    Status  On-going    Target Date  11/01/18            Plan - 09/08/18 1206    Clinical Impression Statement  Patient unable to ambulate more than 3:45 min continuously. Dry needling helped with the arm but then stated arm got heavily tingling and numb while in prone position, relieved with rest. Patient had difficulty tolerating session due to cervical spine, headache, and L UE pain as well as L LE weakness with continuous ambulation. Patient's symptoms were most relieved (slightly) with manual cervical traction. He reports decreased LE and UE symptoms with this intervention, which suggests cervical spine contributor to LE dysfunction. Patient also continues to be hypersensitive to aggravating positions and based on history and presentation has strong likelihood of central sensitization and altered pain processing contributing to his symptom presentation. Patient appears to benefit from interventions directed at the cervical spine and would benefit from further physcal therapy evaluation and treatment to this region. Pt states his most recent spinal specialist suggested injections to this region and he has had some relief of leg and arm symptoms with injections to the cervical spine for 6-12 months at a time, and has also had benefit in the LE from injections to the lumbar spine. He reports  surgery was recommended for his cervical spine by Dr. Carloyn Manner (former neurosurgeon who retired) but patient declined due to being informed that his c-spine rotation would be severely restricted with the surgery. He states he does not feel comfortable with his most recent spine specialist but his insurance will not cover visits to [facility] where he would like to go. Discussed benefits of adding cervical spine to his physical therapy treatment program and patient agreed to this plan. Did not provide high velocity joint mobilization to cervical spine due to presence of ACDF. Patient continues to be severely limited by pain, decreased activity tolerance, and paresthesia. Patient would benefit from continued physical therapy to address remaining impairments and functional limitations to work towards stated goals and return to PLOF or maximal functional independence.    Personal Factors and Comorbidities  Time since onset of injury/illness/exacerbation;Past/Current Experience;Comorbidity 3+    Comorbidities  HTN, hx of hernia repair, migraines, degenerative disc disease in cervical and lumbar spine, hyperlipidemia, at least 4 lumbar surgeries (last in September 2019 that included L3-4 posterior and interbody fusion and left partial facetectomy and left foraminotomy. Previous surgeries included interbody fusion at L4-5 and L5-S1), past cervical spine surgery (ACDF C6-7), multiple spinal injections, hx hernia repair x 2, hx of bilateral septoplasty and nasal turbinate reduction, hx of R knee surgeries for torn meniscus.    Examination-Activity Limitations  Bathing;Hygiene/Grooming;Squat;Stairs;Lift;Bed Mobility;Bend;Stand;Toileting;Carry;Transfers;Sit;Dressing    Examination-Participation Restrictions  Cleaning;Laundry;Shop;Volunteer;Community Activity;Yard Work;Driving;Interpersonal Relationship    Rehab  Potential  Fair    PT Frequency  2x / week    PT Duration  8 weeks    PT Treatment/Interventions  ADLs/Self  Care Home Management;Aquatic Therapy;Cryotherapy;Electrical Stimulation;Moist Heat;Therapeutic activities;Therapeutic exercise;Neuromuscular re-education;Patient/family education;Manual techniques;Passive range of motion;Dry needling;Joint Manipulations;Spinal Manipulations;Other (comment)    PT Next Visit Plan  graded funcitonal exercise as tolerated, pain control; add cervical spine to treatment as able    PT Home Exercise Plan  Sciatic Nerve Flossing    Consulted and Agree with Plan of Care  Patient       Patient will benefit from skilled therapeutic intervention in order to improve the following deficits and impairments:  Decreased endurance, Decreased mobility, Difficulty walking, Hypomobility, Increased muscle spasms, Impaired sensation, Decreased range of motion, Decreased scar mobility, Impaired perceived functional ability, Improper body mechanics, Decreased activity tolerance, Decreased strength, Impaired flexibility, Impaired UE functional use, Pain  Visit Diagnosis: 1. Chronic bilateral low back pain with bilateral sciatica   2. Paresthesia of skin   3. Muscle weakness (generalized)   4. Difficulty in walking, not elsewhere classified   5. Cervicalgia        Problem List Patient Active Problem List   Diagnosis Date Noted  . 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 Ruptured lumbar disc 10/09/2017  . Rhinitis medicamentosa 07/06/2015  . DDD (degenerative disc disease), lumbar 07/20/2014  . Hyperlipidemia 07/20/2014  . Hypertension 07/20/2014  . DDD (degenerative disc disease), cervical 07/20/2014  . Umbilical hernia 32/01/2481    Everlean Alstrom. Graylon Good, PT, DPT 09/08/18, 12:10 PM  [facility] [address], [place], [address] Phone: [phone]   Fax:  [phone]  Name: [name] MRN: [id number] Date of Birth: [birth year]

## 2018-09-13 ENCOUNTER — Encounter: Payer: Self-pay | Admitting: Physical Therapy

## 2018-09-13 ENCOUNTER — Ambulatory Visit: Payer: BLUE CROSS/BLUE SHIELD | Admitting: Physical Therapy

## 2018-09-13 ENCOUNTER — Other Ambulatory Visit: Payer: Self-pay

## 2018-09-13 DIAGNOSIS — M5442 Lumbago with sciatica, left side: Secondary | ICD-10-CM | POA: Diagnosis not present

## 2018-09-13 DIAGNOSIS — M542 Cervicalgia: Secondary | ICD-10-CM

## 2018-09-13 DIAGNOSIS — M6281 Muscle weakness (generalized): Secondary | ICD-10-CM

## 2018-09-13 DIAGNOSIS — G8929 Other chronic pain: Secondary | ICD-10-CM

## 2018-09-13 DIAGNOSIS — R262 Difficulty in walking, not elsewhere classified: Secondary | ICD-10-CM

## 2018-09-13 DIAGNOSIS — R202 Paresthesia of skin: Secondary | ICD-10-CM

## 2018-09-13 NOTE — Therapy (Signed)
[facility] [address] [place], [address] Phone: [phone]   Fax:  [phone]  Physical Therapy Treatment  Patient Details  Name: [name] MRN: [id number] Date of Birth: [birth year] Referring Provider (PT): Haywood Lasso   Encounter Date: 09/13/2018  PT End of Session - 09/13/18 1301    Visit Number  12    Number of Visits  26    Date for PT Re-Evaluation  11/01/18    Authorization Type  [employer] reporting period from 11/01/2018 (new reporting period from 09/06/2018)    Authorization Time Period  Current cert period: 05/10/8030  (latest PN: 09/06/2018);    Authorization - Visit Number  2    Authorization - Number of Visits  10    PT Start Time  1224    PT Stop Time  1115    PT Time Calculation (min)  40 min    Activity Tolerance  Patient tolerated treatment well;Patient limited by pain    Behavior During Therapy  [facility] for tasks assessed/performed;Flat affect       Past Medical History:  Diagnosis Date  . DDD (degenerative disc disease), lumbar   . Dental bridge present    permanant - top left  . Headache    migraines - pinched nerve in neck - no mvmt limitations  . Hernia July 8250   umbilical  . Hypertension     Past Surgical History:  Procedure Laterality Date  . BACK SURGERY  2003, 2007, 2009, 2012   Dr Glenna Fellows  . HERNIA REPAIR  [id number]   umbilical hernia  . KNEE SURGERY Right 1997, 2001   torn miniscus  . LUMBAR DISC SURGERY     X 3 with hardware  . NASAL TURBINATE REDUCTION Bilateral 10/26/2015   Procedure: TURBINATE REDUCTION/SUBMUCOSAL RESECTION;  Surgeon: Beverly Gust, MD;  Location: [facility];  Service: ENT;  Laterality: Bilateral;  . SEPTOPLASTY Bilateral 10/26/2015   Procedure: SEPTOPLASTY;  Surgeon: Beverly Gust, MD;  Location: [facility];  Service: ENT;  Laterality: Bilateral;    There were no vitals filed for this visit.  Subjective Assessment -  09/13/18 1038    Subjective  Patient report he has 9/10 pain upon arrival and that is was worse this morning and he could hardly get out of bed due to the pain. He reports worst pain "like a pinched nerve" or "sciatica" starting from the base of the L spine encircling pelvis to groin area. Reports L leg is numb upon arrival, bilateral arms are numb. Feels that dry needling helped but unable to describe how excatly. States he has trouble describing pain. Stayed in the bed the entire weekend because of pain. Reports chropractice visit helped headache some, but it is still bothering him.    Pertinent History  See body of note for detailed history.    Limitations  Sitting;Lifting;Standing;Walking;House hold activities;Other (comment)    How long can you sit comfortably?  Sitting time: 1.5-2 hours uncomfortable.    How long can you stand comfortably?  Standing time: 1.5-2 hours uncomfortable    How long can you walk comfortably?  Walking: about 5 min or so (but variable), Half the grocery store pushing cart, uncomfortable.    Diagnostic tests  Imaging: MR Lumbar spine report 11/14/2018: "IMPRESSION: 1. Surgical changes from recent surgery with L3-4 posterior and interbody fusion and left partial facetectomy and left foraminotomy. No obvious complicating features such as hematoma. 2. Remote posterior and  interbody fusion changes at L4-5 and L5-S1. 3. Normal appearance of L1-2 and L2-3." MR C-spine report 10/30/2016: "IMPRESSION: Mild spinal stenosis and mild foraminal stenosis bilaterally C3-4 due to spurring unchanged. Progression of left foraminal disc protrusion at C5-6 with associated spurring. Expected compression left C6 nerve root. ACDF C6-7."    Patient Stated Goals  to return to PLOF and decrease pain; to be approved for disability funding until he is able to return to work. Legs buckle once or twice a day.    Currently in Pain?  Yes    Pain Score  9     Pain Location  Back    Pain Orientation  Left     Pain Descriptors / Indicators  --   "like sciatica or a pinched nerve"   Pain Type  Chronic pain    Pain Radiating Towards  BUE numbness, L LE numb.    Pain Onset  More than a month ago    Pain Frequency  Constant        TREATMENT  Therapeutic exercise:to centralize symptoms and improve ROM, strength, muscular endurance, and activity tolerance required for successful completion of functional activities. -NuStep level1using bilateral lower extremitiesonly.Moist heat applied to lumbar spine for increased comfort.Seat setting 10. For improved extremity mobility, muscular endurance, and activity tolerance; and to induce the analgesic effect of aerobic exercise, stimulate improved joint nutrition, and prepare body structures and systems for following interventions. x5 Minutes during subjective exam. Increased pain at L groin initially. .25 miles in 6:52 min. Average SPM = 67.  - leg curl machine 20# x 20 reps. "I can handle this." rated easy.  - leg extension machine 20# x 20 reps. Initial rated medium. Adjusted range. "I can live with this." - seated rows at press machine 2x10 at 25# self-selected weight. No reports of increased pain. - attempted chest press, unable due to L shouler pain "L should is killing me" at 5# resistance modified range. Reports old injury to L shoulder. - attempted AAROM scaption with pulleys, seated, continued to cause L shoulder pain that was uacceptable. Discontinued.  - seated lat pull x20 at 15#, well tolerated. "feels fine" easy.  - seated leg press x20 each side. Seate position 2 from max distance. 20#. Rated medium difficulty. Reported 15# too easy for his satisfaction.  - Education on graded exercise and expected response to interventions.  (manual therapy - see below)  Manual therapy: to reduce pain and tissue tension, improve range of motion, neuromodulation, in order to promote improved ability to complete functional activities. Prone with head in  flat cradle and two pillows under hips for patient comfort.  - STM to lumbar region and upper glute musculature with skin to skin contact focused on L lumbar and upper gluteal fibers where pt reports the most pain and quite TTP "that is the spot". Reports no leg paresthesia while in prone.     PT Education - 09/13/18 1301    Education Details  Exercise purpose/form. Self management techniques    Person(s) Educated  Patient    Methods  Explanation;Demonstration;Verbal cues    Comprehension  Verbalized understanding;Returned demonstration;Verbal cues required       PT Short Term Goals - 09/06/18 1054      PT SHORT TERM GOAL #1   Title  Be independent with initial home exercise program for self-management of symptoms.    Baseline  Initial HEP to be provided at visit 2 (08/04/2018); pt provided with initial HEP, delayed due to  lack of tolerance to activity (08/16/2018); pt reports his participation is "hit or miss" right now (09/06/2018);    Time  2    Period  Weeks    Status  Partially Met    Target Date  08/18/18        PT Long Term Goals - 09/06/18 1054      PT LONG TERM GOAL #1   Title  Be independent with a long-term home exercise program for self-management of symptoms.    Baseline  Initial HEP to provided at visit 2 (08/04/2018); pt reports his participation is "hit or miss" right now (09/06/2018);    Time  8    Period  Weeks    Status  Partially Met    Target Date  11/01/18      PT LONG TERM GOAL #2   Title  Demonstrate improved FOTO score by 10 units to demonstrate improvement in overall condition and self-reported functional ability.    Baseline  FOTO = 37 (08/04/2018); FOTO = 38 (09/06/2018);    Time  8    Period  Weeks    Status  Partially Met    Target Date  11/01/18      PT LONG TERM GOAL #3   Title  Be able to squat to chair height with proper form without limitation due to current condition in order to improve ability to lift and complete transfers during usual and work  activities.    Baseline  see objective exam (08/04/2018); improving but still very limited see objective exam (09/06/2018);    Time  8    Period  Weeks    Status  Partially Met    Target Date  11/01/18      PT LONG TERM GOAL #4   Title  Patient will demonstrate B UE and LE strength 4+/5 or higher to demonstrate functional strength for improved gait, increased standing tolerance, reaching, lifting, carrying and work activities.    Baseline  see objective exam (08/04/2018); improving but still limited, see objective exam (09/06/2018);    Time  8    Period  Weeks    Status  Partially Met    Target Date  11/01/18      PT LONG TERM GOAL #5   Title  Be able to demonstrate floor to waist lift with proper form without limitation due to current condition in order to improve ability to lift during usual and work activities.    Baseline  see objective exam (08/04/2018); used similar technique to previously, squat is slightly deeper see objective exam (09/06/2018);    Time  8    Period  Weeks    Status  On-going    Target Date  11/01/18      PT LONG TERM GOAL #6   Title  Patient will be able to return to full time employment without limitaiton due to current condition.    Baseline  currently unable to tolerate work activities - see subjective and objective exam (08/04/2018); currently unable to tolerate work activities - see subjective and objective exam (09/06/2018);    Time  8    Period  Weeks    Status  On-going    Target Date  11/01/18            Plan - 09/13/18 1407    Clinical Impression Statement  Patient tolerated treatment well and reported pain improvement from 9/10 to 7/10 by end of session. Graded exercise approach was used with goal to complete  exercises with an easy intensity. Patient went up to moderate intensity on some exercises per his preference when he felt the next level down was too easy to be worth doing. Recommend monitoring his response following session and may need to reduce all  to easy depending on post-exercise response. Patient was able to tolerate all exercises well except chest press and AAROM scaption that increased L shoulder pain that lasted to end of session. Patient reported some relief from manual interventions as well. Patient reports he enjoys performing exercises on the equipment tower and may want to get one for his home. L LE paresthesia decreased by end of session and pain radiating to groin gone by end of session. Pt required multimodal cuing for proper technique and to facilitate improved neuromuscular control, strength, range of motion, and functional ability resulting in improved performance and form. Patient would benefit from continued physical therapy to address remaining impairments and functional limitations to work towards stated goals and return to PLOF or maximal functional independence.    Personal Factors and Comorbidities  Time since onset of injury/illness/exacerbation;Past/Current Experience;Comorbidity 3+    Comorbidities  HTN, hx of hernia repair, migraines, degenerative disc disease in cervical and lumbar spine, hyperlipidemia, at least 4 lumbar surgeries (last in September 2019 that included L3-4 posterior and interbody fusion and left partial facetectomy and left foraminotomy. Previous surgeries included interbody fusion at L4-5 and L5-S1), past cervical spine surgery (ACDF C6-7), multiple spinal injections, hx hernia repair x 2, hx of bilateral septoplasty and nasal turbinate reduction, hx of R knee surgeries for torn meniscus.    Examination-Activity Limitations  Bathing;Hygiene/Grooming;Squat;Stairs;Lift;Bed Mobility;Bend;Stand;Toileting;Carry;Transfers;Sit;Dressing    Examination-Participation Restrictions  Cleaning;Laundry;Shop;Volunteer;Community Activity;Yard Work;Driving;Interpersonal Relationship    Rehab Potential  Fair    PT Frequency  2x / week    PT Duration  8 weeks    PT Treatment/Interventions  ADLs/Self Care Home  Management;Aquatic Therapy;Cryotherapy;Electrical Stimulation;Moist Heat;Therapeutic activities;Therapeutic exercise;Neuromuscular re-education;Patient/family education;Manual techniques;Passive range of motion;Dry needling;Joint Manipulations;Spinal Manipulations;Other (comment)    PT Next Visit Plan  graded funcitonal exercise as tolerated, pain control; add cervical spine to treatment as able    PT Home Exercise Plan  Sciatic Nerve Flossing    Consulted and Agree with Plan of Care  Patient       Patient will benefit from skilled therapeutic intervention in order to improve the following deficits and impairments:  Decreased endurance, Decreased mobility, Difficulty walking, Hypomobility, Increased muscle spasms, Impaired sensation, Decreased range of motion, Decreased scar mobility, Impaired perceived functional ability, Improper body mechanics, Decreased activity tolerance, Decreased strength, Impaired flexibility, Impaired UE functional use, Pain  Visit Diagnosis: 1. Chronic bilateral low back pain with bilateral sciatica   2. Paresthesia of skin   3. Muscle weakness (generalized)   4. Difficulty in walking, not elsewhere classified   5. Cervicalgia        Problem List Patient Active Problem List   Diagnosis Date Noted  . 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 Ruptured lumbar disc 10/09/2017  . Rhinitis medicamentosa 07/06/2015  . DDD (degenerative disc disease), lumbar 07/20/2014  . Hyperlipidemia 07/20/2014  . Hypertension 07/20/2014  . DDD (degenerative disc disease), cervical 07/20/2014  . Umbilical hernia 25/18/9842    Everlean Alstrom. Graylon Good, PT, DPT 09/13/18, 2:09 PM  [facility] [address], [place], [address] Phone: [phone]   Fax:  [phone]  Name: [name] MRN: [id number] Date of Birth: 1970/04/07

## 2018-09-15 ENCOUNTER — Encounter: Payer: Self-pay | Admitting: Physical Therapy

## 2018-09-15 ENCOUNTER — Ambulatory Visit: Payer: BLUE CROSS/BLUE SHIELD | Admitting: Physical Therapy

## 2018-09-15 ENCOUNTER — Other Ambulatory Visit: Payer: Self-pay

## 2018-09-15 DIAGNOSIS — M6281 Muscle weakness (generalized): Secondary | ICD-10-CM

## 2018-09-15 DIAGNOSIS — R262 Difficulty in walking, not elsewhere classified: Secondary | ICD-10-CM

## 2018-09-15 DIAGNOSIS — M5442 Lumbago with sciatica, left side: Secondary | ICD-10-CM | POA: Diagnosis not present

## 2018-09-15 DIAGNOSIS — G8929 Other chronic pain: Secondary | ICD-10-CM

## 2018-09-15 DIAGNOSIS — M542 Cervicalgia: Secondary | ICD-10-CM

## 2018-09-15 DIAGNOSIS — R202 Paresthesia of skin: Secondary | ICD-10-CM

## 2018-09-15 DIAGNOSIS — M5441 Lumbago with sciatica, right side: Secondary | ICD-10-CM

## 2018-09-15 NOTE — Therapy (Signed)
[facility] [address], [place], [address] Phone: [phone]   Fax:  [phone]  Physical Therapy Treatment  Patient Details  Name: [name] MRN: [id number] Date of Birth: 12-Sep-1970 Referring Provider (PT): Haywood Lasso   Encounter Date: 09/15/2018  PT End of Session - 09/15/18 1038    Visit Number  13    Number of Visits  26    Date for PT Re-Evaluation  11/01/18    Authorization Type  [employer] reporting period from 11/01/2018 (new reporting period from 09/06/2018)    Authorization Time Period  Current cert period: 06/12/4583  (latest PN: 09/06/2018);    Authorization - Visit Number  3    Authorization - Number of Visits  10    PT Start Time  1033    PT Stop Time  1115    PT Time Calculation (min)  42 min    Activity Tolerance  Patient tolerated treatment well;Patient limited by pain    Behavior During Therapy  [facility] for tasks assessed/performed;Flat affect       Past Medical History:  Diagnosis Date  . DDD (degenerative disc disease), lumbar   . Dental bridge present    permanant - top left  . Headache    migraines - pinched nerve in neck - no mvmt limitations  . Hernia July 9292   umbilical  . Hypertension     Past Surgical History:  Procedure Laterality Date  . BACK SURGERY  2003, 2007, 2009, 2012   Dr Glenna Fellows  . HERNIA REPAIR  [id number]   umbilical hernia  . KNEE SURGERY Right 1997, 2001   torn miniscus  . LUMBAR DISC SURGERY     X 3 with hardware  . NASAL TURBINATE REDUCTION Bilateral 10/26/2015   Procedure: TURBINATE REDUCTION/SUBMUCOSAL RESECTION;  Surgeon: Beverly Gust, MD;  Location: [facility];  Service: ENT;  Laterality: Bilateral;  . SEPTOPLASTY Bilateral 10/26/2015   Procedure: SEPTOPLASTY;  Surgeon: Beverly Gust, MD;  Location: [facility];  Service: ENT;  Laterality: Bilateral;    There were no vitals filed for this visit.  Subjective Assessment -  09/15/18 1034    Subjective  Patient reports low back radiating around left side to groin at about 7-8/10. Numbness in B hands L>R. Can only feel pressure in the legs about the same bilaterally. States he felt better for about 2 hours following last treatment session. He layed down in the afternoon until about 4pm following last treatment session and stayed in bed most of the day yesterday. Agrees that he would like to do something this session similar to last session.  Had usual  intense pain this morning.    Pertinent History  See body of note for detailed history.    Limitations  Sitting;Lifting;Standing;Walking;House hold activities;Other (comment)    How long can you sit comfortably?  Sitting time: 1.5-2 hours uncomfortable.    How long can you stand comfortably?  Standing time: 1.5-2 hours uncomfortable    How long can you walk comfortably?  Walking: about 5 min or so (but variable), Half the grocery store pushing cart, uncomfortable.    Diagnostic tests  Imaging: MR Lumbar spine report 11/14/2018: "IMPRESSION: 1. Surgical changes from recent surgery with L3-4 posterior and interbody fusion and left partial facetectomy and left foraminotomy. No obvious complicating features such as hematoma. 2. Remote posterior and interbody fusion changes at L4-5 and L5-S1. 3. Normal appearance of L1-2 and L2-3." MR  C-spine report 10/30/2016: "IMPRESSION: Mild spinal stenosis and mild foraminal stenosis bilaterally C3-4 due to spurring unchanged. Progression of left foraminal disc protrusion at C5-6 with associated spurring. Expected compression left C6 nerve root. ACDF C6-7."    Patient Stated Goals  to return to PLOF and decrease pain; to be approved for disability funding until he is able to return to work. Legs buckle once or twice a day.    Currently in Pain?  Yes    Pain Score  7     Pain Onset  More than a month ago       TREATMENT  Therapeutic exercise:to centralize symptoms and improve ROM,  strength, muscular endurance, and activity tolerance required for successful completion of functional activities. -NuStep level1using bilateral lower extremitiesonly.Moist heat applied to lumbar spine for increased comfort.Seat setting 10. For improved extremity mobility, muscular endurance, and activity tolerance; and to induce the analgesic effect of aerobic exercise, stimulate improved joint nutrition, and prepare body structures and systems for following interventions. x5 Minutesduring subjective exam.Increased pain at L groin initially. .25 miles in 6:47 min. Average SPM = 67.  - leg extension machine 20# x 20 reps. rated high medium difficulty. Attempted 25# but too much pressure on knees.   - leg curl machine 25# x 20 reps. rated high medium difficulty.  - seated rows at press machine x20 at 25# self-selected weight. No reports of increased pain.  Started extending extending back and lifting off chest support second 10. Rated easy.  - seated lat pull x20-30 (lost count) at 20#. Produced L shoulder pain last 7-8 reps. Rated medium difficulty.  - seated leg press x20 each side. Seate position 2 from max distance. 20#. Rated medium difficulty. Pain in L groin - seated tricep extensions 5# on cable, x20 each side. Rated easy difficulty. (manual therapy - see below)  Manual therapy: to reduce pain and tissue tension, improve range of motion, neuromodulation, in order to promote improved ability to complete functional activities. Prone with head in flat cradle and two pillows under hips for patient comfort.  - STM to lumbar region and upper glute musculature with skin to skin contact focused on L lumbar and upper gluteal fibers where pt reports the most pain and quite TTP "that is the spot". Reports no leg paresthesia while in prone.     PT Education - 09/15/18 1038    Education Details  Exercise purpose/form.    Person(s) Educated  Patient    Methods  Explanation;Demonstration     Comprehension  Verbalized understanding;Returned demonstration       PT Short Term Goals - 09/06/18 1054      PT SHORT TERM GOAL #1   Title  Be independent with initial home exercise program for self-management of symptoms.    Baseline  Initial HEP to be provided at visit 2 (08/04/2018); pt provided with initial HEP, delayed due to lack of tolerance to activity (08/16/2018); pt reports his participation is "hit or miss" right now (09/06/2018);    Time  2    Period  Weeks    Status  Partially Met    Target Date  08/18/18        PT Long Term Goals - 09/06/18 1054      PT LONG TERM GOAL #1   Title  Be independent with a long-term home exercise program for self-management of symptoms.    Baseline  Initial HEP to provided at visit 2 (08/04/2018); pt reports his participation is "hit or  miss" right now (09/06/2018);    Time  8    Period  Weeks    Status  Partially Met    Target Date  11/01/18      PT LONG TERM GOAL #2   Title  Demonstrate improved FOTO score by 10 units to demonstrate improvement in overall condition and self-reported functional ability.    Baseline  FOTO = 37 (08/04/2018); FOTO = 38 (09/06/2018);    Time  8    Period  Weeks    Status  Partially Met    Target Date  11/01/18      PT LONG TERM GOAL #3   Title  Be able to squat to chair height with proper form without limitation due to current condition in order to improve ability to lift and complete transfers during usual and work activities.    Baseline  see objective exam (08/04/2018); improving but still very limited see objective exam (09/06/2018);    Time  8    Period  Weeks    Status  Partially Met    Target Date  11/01/18      PT LONG TERM GOAL #4   Title  Patient will demonstrate B UE and LE strength 4+/5 or higher to demonstrate functional strength for improved gait, increased standing tolerance, reaching, lifting, carrying and work activities.    Baseline  see objective exam (08/04/2018); improving but still limited, see  objective exam (09/06/2018);    Time  8    Period  Weeks    Status  Partially Met    Target Date  11/01/18      PT LONG TERM GOAL #5   Title  Be able to demonstrate floor to waist lift with proper form without limitation due to current condition in order to improve ability to lift during usual and work activities.    Baseline  see objective exam (08/04/2018); used similar technique to previously, squat is slightly deeper see objective exam (09/06/2018);    Time  8    Period  Weeks    Status  On-going    Target Date  11/01/18      PT LONG TERM GOAL #6   Title  Patient will be able to return to full time employment without limitaiton due to current condition.    Baseline  currently unable to tolerate work activities - see subjective and objective exam (08/04/2018); currently unable to tolerate work activities - see subjective and objective exam (09/06/2018);    Time  8    Period  Weeks    Status  On-going    Target Date  11/01/18            Plan - 09/15/18 2003    Clinical Impression Statement  Patient tolerated treatment well today and moved through the exercises more quickly than last session. He continued to have some increased pain in the L shoulder, but exercises were modified to avoid provocation of this non-constant pain. patient was able to advance to slightly higher weight and rep number compared to last session. Patient appears to be engaging with strengthening exercises in a positive manner. Pt required multimodal cuing for proper technique and to facilitate improved neuromuscular control, strength, range of motion, and functional ability resulting in improved performance and form. Patient would benefit from continued physical therapy to address remaining impairments and functional limitations to work towards stated goals and return to PLOF or maximal functional independence.    Personal Factors and Comorbidities  Time since  onset of injury/illness/exacerbation;Past/Current  Experience;Comorbidity 3+    Comorbidities  HTN, hx of hernia repair, migraines, degenerative disc disease in cervical and lumbar spine, hyperlipidemia, at least 4 lumbar surgeries (last in September 2019 that included L3-4 posterior and interbody fusion and left partial facetectomy and left foraminotomy. Previous surgeries included interbody fusion at L4-5 and L5-S1), past cervical spine surgery (ACDF C6-7), multiple spinal injections, hx hernia repair x 2, hx of bilateral septoplasty and nasal turbinate reduction, hx of R knee surgeries for torn meniscus.    Examination-Activity Limitations  Bathing;Hygiene/Grooming;Squat;Stairs;Lift;Bed Mobility;Bend;Stand;Toileting;Carry;Transfers;Sit;Dressing    Examination-Participation Restrictions  Cleaning;Laundry;Shop;Volunteer;Community Activity;Yard Work;Driving;Interpersonal Relationship    Rehab Potential  Fair    PT Frequency  2x / week    PT Duration  8 weeks    PT Treatment/Interventions  ADLs/Self Care Home Management;Aquatic Therapy;Cryotherapy;Electrical Stimulation;Moist Heat;Therapeutic activities;Therapeutic exercise;Neuromuscular re-education;Patient/family education;Manual techniques;Passive range of motion;Dry needling;Joint Manipulations;Spinal Manipulations;Other (comment)    PT Next Visit Plan  graded funcitonal exercise as tolerated, pain control; add cervical spine to treatment as able    PT Home Exercise Plan  Sciatic Nerve Flossing    Consulted and Agree with Plan of Care  Patient       Patient will benefit from skilled therapeutic intervention in order to improve the following deficits and impairments:  Decreased endurance, Decreased mobility, Difficulty walking, Hypomobility, Increased muscle spasms, Impaired sensation, Decreased range of motion, Decreased scar mobility, Impaired perceived functional ability, Improper body mechanics, Decreased activity tolerance, Decreased strength, Impaired flexibility, Impaired UE functional use,  Pain  Visit Diagnosis: 1. Chronic bilateral low back pain with bilateral sciatica   2. Paresthesia of skin   3. Muscle weakness (generalized)   4. Difficulty in walking, not elsewhere classified   5. Cervicalgia        Problem List Patient Active Problem List   Diagnosis Date Noted  . 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 Ruptured lumbar disc 10/09/2017  . Rhinitis medicamentosa 07/06/2015  . DDD (degenerative disc disease), lumbar 07/20/2014  . Hyperlipidemia 07/20/2014  . Hypertension 07/20/2014  . DDD (degenerative disc disease), cervical 07/20/2014  . Umbilical hernia 94/76/5465   Everlean Alstrom. Graylon Good, PT, DPT 09/15/18, 8:04 PM  [facility] [address] [place], [address] Phone: [phone]   Fax:  [phone]  Name: [name] MRN: [id number] Date of Birth: [birth year]

## 2018-09-20 ENCOUNTER — Other Ambulatory Visit: Payer: Self-pay

## 2018-09-20 ENCOUNTER — Encounter: Payer: Self-pay | Admitting: Physical Therapy

## 2018-09-20 ENCOUNTER — Ambulatory Visit: Payer: BLUE CROSS/BLUE SHIELD | Admitting: Physical Therapy

## 2018-09-20 DIAGNOSIS — G8929 Other chronic pain: Secondary | ICD-10-CM

## 2018-09-20 DIAGNOSIS — R262 Difficulty in walking, not elsewhere classified: Secondary | ICD-10-CM

## 2018-09-20 DIAGNOSIS — R202 Paresthesia of skin: Secondary | ICD-10-CM

## 2018-09-20 DIAGNOSIS — M542 Cervicalgia: Secondary | ICD-10-CM

## 2018-09-20 DIAGNOSIS — M5442 Lumbago with sciatica, left side: Secondary | ICD-10-CM | POA: Diagnosis not present

## 2018-09-20 DIAGNOSIS — M6281 Muscle weakness (generalized): Secondary | ICD-10-CM

## 2018-09-20 NOTE — Therapy (Signed)
[facility] [address], [place], [address] Phone: [phone]   Fax:  [phone]  Physical Therapy Treatment  Patient Details  Name: [name] MRN: [id number] Date of Birth: Mar 11, 1970 Referring Provider (PT): Haywood Lasso   Encounter Date: 09/20/2018  PT End of Session - 09/20/18 1031    Visit Number  14    Number of Visits  26    Date for PT Re-Evaluation  11/01/18    Authorization Type  [employer] reporting period from 11/01/2018 (new reporting period from 09/06/2018)    Authorization Time Period  Current cert period: 04/07/5972  (latest PN: 09/06/2018);    Authorization - Visit Number  4    Authorization - Number of Visits  10    PT Start Time  1025    PT Stop Time  1110    PT Time Calculation (min)  45 min    Activity Tolerance  Patient tolerated treatment well;Patient limited by pain    Behavior During Therapy  [facility] for tasks assessed/performed;Flat affect       Past Medical History:  Diagnosis Date  . DDD (degenerative disc disease), lumbar   . Dental bridge present    permanant - top left  . Headache    migraines - pinched nerve in neck - no mvmt limitations  . Hernia July 1638   umbilical  . Hypertension     Past Surgical History:  Procedure Laterality Date  . BACK SURGERY  2003, 2007, 2009, 2012   Dr Glenna Fellows  . HERNIA REPAIR  [id number]   umbilical hernia  . KNEE SURGERY Right 1997, 2001   torn miniscus  . LUMBAR DISC SURGERY     X 3 with hardware  . NASAL TURBINATE REDUCTION Bilateral 10/26/2015   Procedure: TURBINATE REDUCTION/SUBMUCOSAL RESECTION;  Surgeon: Beverly Gust, MD;  Location: [facility];  Service: ENT;  Laterality: Bilateral;  . SEPTOPLASTY Bilateral 10/26/2015   Procedure: SEPTOPLASTY;  Surgeon: Beverly Gust, MD;  Location: [facility];  Service: ENT;  Laterality: Bilateral;    There were no vitals filed for this visit.  Subjective Assessment -  09/20/18 1029    Subjective  Patient reports his pain has been high at 10/10 since friday. He has spent 4 hours in the bed each day since last week, which is about his average. He used to get by with some days without it, but not lately. Reports his worst pain is above and below the cage in his back and circling his left hip. He describes his legs as "there" but not normal, L > R. Legs also not painful. L shoulder hurts. States his hand numbness is not as bad as usual. Reports high levels of pain in thoracic spine and neck as well.    Pertinent History  See body of initial eval note for detailed history.    Limitations  Sitting;Lifting;Standing;Walking;House hold activities;Other (comment)    How long can you sit comfortably?  Sitting time: 1.5-2 hours uncomfortable.    How long can you stand comfortably?  Standing time: 1.5-2 hours uncomfortable    How long can you walk comfortably?  Walking: about 5 min or so (but variable), Half the grocery store pushing cart, uncomfortable.    Diagnostic tests  Imaging: MR Lumbar spine report 11/14/2018: "IMPRESSION: 1. Surgical changes from recent surgery with L3-4 posterior and interbody fusion and left partial facetectomy and left foraminotomy. No obvious complicating features such as hematoma. 2.  Remote posterior and interbody fusion changes at L4-5 and L5-S1. 3. Normal appearance of L1-2 and L2-3." MR C-spine report 10/30/2016: "IMPRESSION: Mild spinal stenosis and mild foraminal stenosis bilaterally C3-4 due to spurring unchanged. Progression of left foraminal disc protrusion at C5-6 with associated spurring. Expected compression left C6 nerve root. ACDF C6-7."    Patient Stated Goals  to return to PLOF and decrease pain; to be approved for disability funding until he is able to return to work. Legs buckle once or twice a day.    Currently in Pain?  Yes    Pain Score  10-Worst pain ever    Pain Location  Back    Pain Onset  More than a month ago        TREATMENT  Therapeutic exercise:to centralize symptoms and improve ROM, strength, muscular endurance, and activity tolerance required for successful completion of functional activities. -NuStep level1using bilateral lower extremitiesonly.Moist heat applied to lumbar spine for increased comfort.Seat setting 10. For improved extremity mobility, muscular endurance, and activity tolerance; and to induce the analgesic effect of aerobic exercise, stimulate improved joint nutrition, and prepare body structures and systems for following interventions. during subjective exam.Increased pain at L groin initially. .37 miles in 7:56 min. Average SPM = 85. Noted for increased L arm paresthesia > 5 min of activity.  - leg machine knee extension at 15# x10,  2x5 reps. hard difficulty (knee and calf pain, left side pain).  - leg curl machine 20# x 20 reps. rated easy difficulty.  (manual therapy - see below) - supine sciatic nerve floss with AAROM, slider technique x 15 each side.  - seated rows at press machine x20 at 15# self-selected weight.No reports of increased pain.  Started extending extending back and lifting off chest support second 10. Rated easy.  - seated lat pull x15 at 20#. Produced L shoulder pain at end of set. Rated medium difficulty.  - seated leg press x10 each side. Seated position 2 from max distance. 25#. Rated hard difficulty. Pt wanted to use 25#s as other patient was using.  Manual therapy:to reduce pain and tissue tension, improve range of motion, neuromodulation, in order to promote improved ability to complete functional activities. Supine with legs propped on bolster except during lumbar distraction when bolster was removed and pt self-selected hooklying or supine with non-distracted leg. - light STM to bilateral posterior cervical spine musculature to help relax muscles in preparation for manual cervical distraction  - manual distraction to cervical spine x 5 x 30  second- 1 minute. Minimal effects reported by patient.  - manual distraction to the lumbar spine through long axis distraction of the hips. X 30 seconds through R leg, then 2x10 min each leg using mob belt to securely pull from ankle with hip midway between open and close pack position. Patient reported feeling it in his lumbar spine and that it improved pain there.  - hooklying HVLA thrust through shoulders to mid thoracic spine with wedge positioned at targeted levels (T8-T9) to relieve thoracic pain patient reported. [place] noted both times with significant improvement in pain to the thoracic region.   PT Education - 09/20/18 1031    Education Details  Exercise purpose/form    Person(s) Educated  Patient    Methods  Explanation;Demonstration;Tactile cues;Verbal cues    Comprehension  Verbalized understanding;Returned demonstration;Verbal cues required;Tactile cues required       PT Short Term Goals - 09/06/18 1054      PT SHORT TERM GOAL #1  Title  Be independent with initial home exercise program for self-management of symptoms.    Baseline  Initial HEP to be provided at visit 2 (08/04/2018); pt provided with initial HEP, delayed due to lack of tolerance to activity (08/16/2018); pt reports his participation is "hit or miss" right now (09/06/2018);    Time  2    Period  Weeks    Status  Partially Met    Target Date  08/18/18        PT Long Term Goals - 09/06/18 1054      PT LONG TERM GOAL #1   Title  Be independent with a long-term home exercise program for self-management of symptoms.    Baseline  Initial HEP to provided at visit 2 (08/04/2018); pt reports his participation is "hit or miss" right now (09/06/2018);    Time  8    Period  Weeks    Status  Partially Met    Target Date  11/01/18      PT LONG TERM GOAL #2   Title  Demonstrate improved FOTO score by 10 units to demonstrate improvement in overall condition and self-reported functional ability.    Baseline  FOTO = 37  (08/04/2018); FOTO = 38 (09/06/2018);    Time  8    Period  Weeks    Status  Partially Met    Target Date  11/01/18      PT LONG TERM GOAL #3   Title  Be able to squat to chair height with proper form without limitation due to current condition in order to improve ability to lift and complete transfers during usual and work activities.    Baseline  see objective exam (08/04/2018); improving but still very limited see objective exam (09/06/2018);    Time  8    Period  Weeks    Status  Partially Met    Target Date  11/01/18      PT LONG TERM GOAL #4   Title  Patient will demonstrate B UE and LE strength 4+/5 or higher to demonstrate functional strength for improved gait, increased standing tolerance, reaching, lifting, carrying and work activities.    Baseline  see objective exam (08/04/2018); improving but still limited, see objective exam (09/06/2018);    Time  8    Period  Weeks    Status  Partially Met    Target Date  11/01/18      PT LONG TERM GOAL #5   Title  Be able to demonstrate floor to waist lift with proper form without limitation due to current condition in order to improve ability to lift during usual and work activities.    Baseline  see objective exam (08/04/2018); used similar technique to previously, squat is slightly deeper see objective exam (09/06/2018);    Time  8    Period  Weeks    Status  On-going    Target Date  11/01/18      PT LONG TERM GOAL #6   Title  Patient will be able to return to full time employment without limitaiton due to current condition.    Baseline  currently unable to tolerate work activities - see subjective and objective exam (08/04/2018); currently unable to tolerate work activities - see subjective and objective exam (09/06/2018);    Time  8    Period  Weeks    Status  On-going    Target Date  11/01/18            Plan -  09/20/18 2021    Clinical Impression Statement  Patient tolerated treatment well overall but had more difficulty with exercises  today and reported elevated pain at baseline. Patient consistently rated activities harder in difficulty compared to last treatment session and required shorter sets at times. Resistance was decreased to reduce difficulty level as appropriate. Patient has difficulty expressing his pain experience, but stated that he felt he did pretty good until the leg press at the end, which significantly elevated his pain. Patient did not seem to get significant relief from cervical spine distraction as he has in the past, but did get some relief from manual lumbar traction and the most relief from thoracic spine grade V CPA joint mobilization using the wedge. Patient continues to report high levels of pain drives him to stay in bed at least 4 hours per day per his report. Educated him on the benefits of keeping a log of time spent in bed during the day to help monitor progress and activity tolerance. Patient would benefit from continued physical therapy to address remaining impairments and functional limitations to work towards stated goals and return to PLOF or maximal functional independence.    Personal Factors and Comorbidities  Time since onset of injury/illness/exacerbation;Past/Current Experience;Comorbidity 3+    Comorbidities  HTN, hx of hernia repair, migraines, degenerative disc disease in cervical and lumbar spine, hyperlipidemia, at least 4 lumbar surgeries (last in September 2019 that included L3-4 posterior and interbody fusion and left partial facetectomy and left foraminotomy. Previous surgeries included interbody fusion at L4-5 and L5-S1), past cervical spine surgery (ACDF C6-7), multiple spinal injections, hx hernia repair x 2, hx of bilateral septoplasty and nasal turbinate reduction, hx of R knee surgeries for torn meniscus.    Examination-Activity Limitations  Bathing;Hygiene/Grooming;Squat;Stairs;Lift;Bed Mobility;Bend;Stand;Toileting;Carry;Transfers;Sit;Dressing    Examination-Participation  Restrictions  Cleaning;Laundry;Shop;Volunteer;Community Activity;Yard Work;Driving;Interpersonal Relationship    Rehab Potential  Fair    PT Frequency  2x / week    PT Duration  8 weeks    PT Treatment/Interventions  ADLs/Self Care Home Management;Aquatic Therapy;Cryotherapy;Electrical Stimulation;Moist Heat;Therapeutic activities;Therapeutic exercise;Neuromuscular re-education;Patient/family education;Manual techniques;Passive range of motion;Dry needling;Joint Manipulations;Spinal Manipulations;Other (comment)    PT Next Visit Plan  graded funcitonal exercise as tolerated, pain control; add cervical spine to treatment as able    PT Home Exercise Plan  Sciatic Nerve Flossing    Consulted and Agree with Plan of Care  Patient       Patient will benefit from skilled therapeutic intervention in order to improve the following deficits and impairments:  Decreased endurance, Decreased mobility, Difficulty walking, Hypomobility, Increased muscle spasms, Impaired sensation, Decreased range of motion, Decreased scar mobility, Impaired perceived functional ability, Improper body mechanics, Decreased activity tolerance, Decreased strength, Impaired flexibility, Impaired UE functional use, Pain  Visit Diagnosis: 1. Chronic bilateral low back pain with bilateral sciatica   2. Paresthesia of skin   3. Muscle weakness (generalized)   4. Difficulty in walking, not elsewhere classified   5. Cervicalgia        Problem List Patient Active Problem List   Diagnosis Date Noted  . 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 Ruptured lumbar disc 10/09/2017  . Rhinitis medicamentosa 07/06/2015  . DDD (degenerative disc disease), lumbar 07/20/2014  . Hyperlipidemia 07/20/2014  . Hypertension 07/20/2014  . DDD (degenerative disc disease), cervical 07/20/2014  . Umbilical hernia 12/45/8099    Everlean Alstrom. Graylon Good, PT, DPT 09/20/18, 8:22 PM  [facility] [address],  [place], [address] Phone: [phone]   Fax:  [phone]  Name: [name] MRN: [id number] Date of Birth: Oct 24, 1970

## 2018-09-22 ENCOUNTER — Other Ambulatory Visit: Payer: Self-pay

## 2018-09-22 ENCOUNTER — Ambulatory Visit: Payer: BLUE CROSS/BLUE SHIELD | Admitting: Physical Therapy

## 2018-09-22 ENCOUNTER — Encounter: Payer: Self-pay | Admitting: Physical Therapy

## 2018-09-22 DIAGNOSIS — M542 Cervicalgia: Secondary | ICD-10-CM

## 2018-09-22 DIAGNOSIS — G8929 Other chronic pain: Secondary | ICD-10-CM

## 2018-09-22 DIAGNOSIS — M5442 Lumbago with sciatica, left side: Secondary | ICD-10-CM

## 2018-09-22 DIAGNOSIS — R202 Paresthesia of skin: Secondary | ICD-10-CM

## 2018-09-22 DIAGNOSIS — R262 Difficulty in walking, not elsewhere classified: Secondary | ICD-10-CM

## 2018-09-22 DIAGNOSIS — M6281 Muscle weakness (generalized): Secondary | ICD-10-CM

## 2018-09-22 NOTE — Therapy (Signed)
[facility] [address], [place], [address] Phone: [phone]   Fax:  [phone]  Physical Therapy Treatment  Patient Details  Name: [name] MRN: [id number] Date of Birth: 10/12/1970 Referring Provider (PT): Haywood Lasso   Encounter Date: 09/22/2018  PT End of Session - 09/22/18 1035    Visit Number  15    Number of Visits  26    Date for PT Re-Evaluation  11/01/18    Authorization Type  [employer] reporting period from 09/06/2018)    Authorization Time Period  Current cert period: 05/10/6544- 11/01/2018  (latest PN: 09/06/2018);    Authorization - Visit Number  5    Authorization - Number of Visits  10    PT Start Time  1030    PT Stop Time  1130    PT Time Calculation (min)  60 min    Activity Tolerance  Patient tolerated treatment well;Patient limited by pain    Behavior During Therapy  WFL for tasks assessed/performed;Flat affect       Past Medical History:  Diagnosis Date  . DDD (degenerative disc disease), lumbar   . Dental bridge present    permanant - top left  . Headache    migraines - pinched nerve in neck - no mvmt limitations  . Hernia July 5035   umbilical  . Hypertension     Past Surgical History:  Procedure Laterality Date  . BACK SURGERY  2003, 2007, 2009, 2012   Dr Glenna Fellows  . HERNIA REPAIR  [id number]   umbilical hernia  . KNEE SURGERY Right 1997, 2001   torn miniscus  . LUMBAR DISC SURGERY     X 3 with hardware  . NASAL TURBINATE REDUCTION Bilateral 10/26/2015   Procedure: TURBINATE REDUCTION/SUBMUCOSAL RESECTION;  Surgeon: Beverly Gust, MD;  Location: [facility];  Service: ENT;  Laterality: Bilateral;  . SEPTOPLASTY Bilateral 10/26/2015   Procedure: SEPTOPLASTY;  Surgeon: Beverly Gust, MD;  Location: [facility];  Service: ENT;  Laterality: Bilateral;    There were no vitals filed for this visit.  Subjective Assessment - 09/22/18 1032    Subjective   Patient reports his pain is better today at 7/10. He reports he went to bed for 4 hours following last treatment session and was in bed 4.5 hours in the afternoon yesterday. Today pain is in lower back. Legs are "there" about even bilaterally. Both arms are numb. L shoulder is aching (slept on it last night).    Pertinent History  See body of initial eval note for detailed history.    Limitations  Sitting;Lifting;Standing;Walking;House hold activities;Other (comment)    How long can you sit comfortably?  Sitting time: 1.5-2 hours uncomfortable.    How long can you stand comfortably?  Standing time: 1.5-2 hours uncomfortable    How long can you walk comfortably?  Walking: about 5 min or so (but variable), Half the grocery store pushing cart, uncomfortable.    Diagnostic tests  Imaging: MR Lumbar spine report 11/14/2018: "IMPRESSION: 1. Surgical changes from recent surgery with L3-4 posterior and interbody fusion and left partial facetectomy and left foraminotomy. No obvious complicating features such as hematoma. 2. Remote posterior and interbody fusion changes at L4-5 and L5-S1. 3. Normal appearance of L1-2 and L2-3." MR C-spine report 10/30/2016: "IMPRESSION: Mild spinal stenosis and mild foraminal stenosis bilaterally C3-4 due to spurring unchanged. Progression of left foraminal disc protrusion at C5-6 with associated spurring. Expected compression  left C6 nerve root. ACDF C6-7."    Patient Stated Goals  to return to PLOF and decrease pain; to be approved for disability funding until he is able to return to work. Legs buckle once or twice a day.    Currently in Pain?  Yes    Pain Score  7     Pain Location  Back    Pain Onset  More than a month ago        TREATMENT Used to like to play soft ball and volley ball but has not played in years.   Therapeutic exercise:to centralize symptoms and improve ROM, strength, muscular endurance, and activity tolerance required for successful completion of  functional activities. -NuStep level2 using bilateral lower extremitiesonly.Seat setting 10. For improved extremity mobility, muscular endurance, and activity tolerance; and to induce the analgesic effect of aerobic exercise, stimulate improved joint nutrition, and prepare body structures and systems for following interventions. during subjective exam.Increased pain at L groin initially. .26 miles in 5:24 min. Average SPM = 86. Noted for increased L arm paresthesia > 5 min of activity.  - leg machine knee extension at 15# x10,  2x10 reps.  Rated "hard enough" difficulty (knee and calf pain, left side pain).   - leg curl machine 25# x 20 reps. ratedmedium difficulty.  - seated leg press 2x10 each side. Seated position 2 from max distance. 25#. Pt wanted to use 25#. Rated "second set was tough" - seated rows at press machinex20at 15# self-selected weight.No reports of increased pain. keeping chest off support throughout.Rated medium. L UE tingling.  - seated lat pull x20 at 20#. L shoulder didn't hurt as bad. Rated medium difficulty.   - seated bicep curls x15  5# dumbbell with light grip. Difficulty rated "good medium" Attempted 7# dumbbell but too difficult.  - seated tricep extensions 5# on cable, x20 each side. Rated medium difficulty. - seated volley ball taps (back and forth to clinician) while seated at edge of chair on dynadisc to require trunk stability during the activity. Several volleys for approximately 4 min.  (manual therapy - see below) - supine sciatic nerve floss with AAROM, slider technique x 15 each side.   Manual therapy:to reduce pain and tissue tension, improve range of motion, neuromodulation, in order to promote improved ability to complete functional activities.   Supine with legs propped on bolster, no pillow under head: - light STM to bilateral posterior cervical spine musculature to help relax muscles in preparation for manual cervical distraction  - manual  distraction to cervical spine x 5 x 30 second- 1 minute. Minimal effects reported by patient.  - manual distraction to the lumbar spine through long axis distraction of the hips. 3x60 secondsthrough each leg using mob belt to securely pull from ankle with hip midway between open and close pack position. Patient reported feeling it in his lumbar spine and that it improved pain there.   Prone with two pillows under abdomen, head in flat cradle, and pillow under feet:  - Superficial and moderately deep STM to lower lumbar through mid thoracic psine soft tissue and upper glutes, with focus over tender areas and healed surgical scars to decrease sensitivity and improve tissue mobility.    PT Education - 09/22/18 1035    Education Details  Exercise purpose/form    Person(s) Educated  Patient    Methods  Explanation;Demonstration;Verbal cues    Comprehension  Verbalized understanding;Returned demonstration       PT Short Term Goals -  09/06/18 1054      PT SHORT TERM GOAL #1   Title  Be independent with initial home exercise program for self-management of symptoms.    Baseline  Initial HEP to be provided at visit 2 (08/04/2018); pt provided with initial HEP, delayed due to lack of tolerance to activity (08/16/2018); pt reports his participation is "hit or miss" right now (09/06/2018);    Time  2    Period  Weeks    Status  Partially Met    Target Date  08/18/18        PT Long Term Goals - 09/06/18 1054      PT LONG TERM GOAL #1   Title  Be independent with a long-term home exercise program for self-management of symptoms.    Baseline  Initial HEP to provided at visit 2 (08/04/2018); pt reports his participation is "hit or miss" right now (09/06/2018);    Time  8    Period  Weeks    Status  Partially Met    Target Date  11/01/18      PT LONG TERM GOAL #2   Title  Demonstrate improved FOTO score by 10 units to demonstrate improvement in overall condition and self-reported functional ability.     Baseline  FOTO = 37 (08/04/2018); FOTO = 38 (09/06/2018);    Time  8    Period  Weeks    Status  Partially Met    Target Date  11/01/18      PT LONG TERM GOAL #3   Title  Be able to squat to chair height with proper form without limitation due to current condition in order to improve ability to lift and complete transfers during usual and work activities.    Baseline  see objective exam (08/04/2018); improving but still very limited see objective exam (09/06/2018);    Time  8    Period  Weeks    Status  Partially Met    Target Date  11/01/18      PT LONG TERM GOAL #4   Title  Patient will demonstrate B UE and LE strength 4+/5 or higher to demonstrate functional strength for improved gait, increased standing tolerance, reaching, lifting, carrying and work activities.    Baseline  see objective exam (08/04/2018); improving but still limited, see objective exam (09/06/2018);    Time  8    Period  Weeks    Status  Partially Met    Target Date  11/01/18      PT LONG TERM GOAL #5   Title  Be able to demonstrate floor to waist lift with proper form without limitation due to current condition in order to improve ability to lift during usual and work activities.    Baseline  see objective exam (08/04/2018); used similar technique to previously, squat is slightly deeper see objective exam (09/06/2018);    Time  8    Period  Weeks    Status  On-going    Target Date  11/01/18      PT LONG TERM GOAL #6   Title  Patient will be able to return to full time employment without limitaiton due to current condition.    Baseline  currently unable to tolerate work activities - see subjective and objective exam (08/04/2018); currently unable to tolerate work activities - see subjective and objective exam (09/06/2018);    Time  8    Period  Weeks    Status  On-going    Target Date  11/01/18            Plan - 09/22/18 1154    Clinical Impression Statement  Patient tolerated treatment well today and was able to  increase resistance slightly compared to last treatment session and complete more exercises than last session. Overall pain was lower today. He continues to complain of paresthesia in all 4 extremities and felt his hands were swelling some following volleyball taps. Volley ball taps were included to replicate formerly enjoyable activity and distract patient's mind from focusing on pain during movement. He noted it hurt in his hands some and low back when reaching, but overall was tolerable to him and improved his mood during the activity. Patient continues to be very limited in his activity tolerance for functional activities due to ongoing widespread pain and paresthesia. He reported mild increase in soreness by end of session. Patient would benefit from continued physical therapy to address remaining impairments and functional limitations to work towards stated goals and return to PLOF or maximal functional independence    Personal Factors and Comorbidities  Time since onset of injury/illness/exacerbation;Past/Current Experience;Comorbidity 3+    Comorbidities  HTN, hx of hernia repair, migraines, degenerative disc disease in cervical and lumbar spine, hyperlipidemia, at least 4 lumbar surgeries (last in September 2019 that included L3-4 posterior and interbody fusion and left partial facetectomy and left foraminotomy. Previous surgeries included interbody fusion at L4-5 and L5-S1), past cervical spine surgery (ACDF C6-7), multiple spinal injections, hx hernia repair x 2, hx of bilateral septoplasty and nasal turbinate reduction, hx of R knee surgeries for torn meniscus.    Examination-Activity Limitations  Bathing;Hygiene/Grooming;Squat;Stairs;Lift;Bed Mobility;Bend;Stand;Toileting;Carry;Transfers;Sit;Dressing    Examination-Participation Restrictions  Cleaning;Laundry;Shop;Volunteer;Community Activity;Yard Work;Driving;Interpersonal Relationship    Rehab Potential  Fair    PT Frequency  2x / week    PT  Duration  8 weeks    PT Treatment/Interventions  ADLs/Self Care Home Management;Aquatic Therapy;Cryotherapy;Electrical Stimulation;Moist Heat;Therapeutic activities;Therapeutic exercise;Neuromuscular re-education;Patient/family education;Manual techniques;Passive range of motion;Dry needling;Joint Manipulations;Spinal Manipulations;Other (comment)    PT Next Visit Plan  graded funcitonal exercise as tolerated, pain control; add cervical spine to treatment as able    PT Home Exercise Plan  Sciatic Nerve Flossing    Consulted and Agree with Plan of Care  Patient       Patient will benefit from skilled therapeutic intervention in order to improve the following deficits and impairments:  Decreased endurance, Decreased mobility, Difficulty walking, Hypomobility, Increased muscle spasms, Impaired sensation, Decreased range of motion, Decreased scar mobility, Impaired perceived functional ability, Improper body mechanics, Decreased activity tolerance, Decreased strength, Impaired flexibility, Impaired UE functional use, Pain  Visit Diagnosis: 1. Chronic bilateral low back pain with bilateral sciatica   2. Paresthesia of skin   3. Muscle weakness (generalized)   4. Difficulty in walking, not elsewhere classified   5. Cervicalgia        Problem List Patient Active Problem List   Diagnosis Date Noted  . 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 Ruptured lumbar disc 10/09/2017  . Rhinitis medicamentosa 07/06/2015  . DDD (degenerative disc disease), lumbar 07/20/2014  . Hyperlipidemia 07/20/2014  . Hypertension 07/20/2014  . DDD (degenerative disc disease), cervical 07/20/2014  . Umbilical hernia 67/67/2094    Everlean Alstrom. Graylon Good, PT, DPT 09/22/18, 11:55 AM  [facility] [address] [place], [address] Phone: [phone]   Fax:  [phone]  Name: [name] MRN: [id number] Date of Birth: [birth year]

## 2018-09-27 ENCOUNTER — Other Ambulatory Visit: Payer: Self-pay

## 2018-09-27 ENCOUNTER — Encounter: Payer: Self-pay | Admitting: Physical Therapy

## 2018-09-27 ENCOUNTER — Ambulatory Visit: Payer: BLUE CROSS/BLUE SHIELD | Admitting: Physical Therapy

## 2018-09-27 DIAGNOSIS — M6281 Muscle weakness (generalized): Secondary | ICD-10-CM

## 2018-09-27 DIAGNOSIS — G8929 Other chronic pain: Secondary | ICD-10-CM

## 2018-09-27 DIAGNOSIS — R262 Difficulty in walking, not elsewhere classified: Secondary | ICD-10-CM

## 2018-09-27 DIAGNOSIS — M542 Cervicalgia: Secondary | ICD-10-CM

## 2018-09-27 DIAGNOSIS — R202 Paresthesia of skin: Secondary | ICD-10-CM

## 2018-09-27 DIAGNOSIS — M5442 Lumbago with sciatica, left side: Secondary | ICD-10-CM | POA: Diagnosis not present

## 2018-09-27 NOTE — Therapy (Signed)
[facility] [address], [place], [address] Phone: [phone]   Fax:  [phone]  Physical Therapy Treatment  Patient Details  Name: [name] MRN: [id number] Date of Birth: [birth year] Referring Provider (PT): Haywood Lasso   Encounter Date: 09/27/2018  PT End of Session - 09/27/18 1141    Visit Number  16    Number of Visits  26    Date for PT Re-Evaluation  11/01/18    Authorization Type  [employer] reporting period from 09/06/2018)    Authorization Time Period  Current cert period: 08/07/1698- 11/01/2018  (latest PN: 09/06/2018);    Authorization - Visit Number  6    Authorization - Number of Visits  10    PT Start Time  1749    PT Stop Time  1113    PT Time Calculation (min)  38 min    Activity Tolerance  Patient limited by pain    Behavior During Therapy  [facility] for tasks assessed/performed;Flat affect       Past Medical History:  Diagnosis Date  . DDD (degenerative disc disease), lumbar   . Dental bridge present    permanant - top left  . Headache    migraines - pinched nerve in neck - no mvmt limitations  . Hernia July [id number]   umbilical  . Hypertension     Past Surgical History:  Procedure Laterality Date  . BACK SURGERY  2003, 2007, 2009, 2012   Dr Glenna Fellows  . HERNIA REPAIR  [id number]   umbilical hernia  . KNEE SURGERY Right 1997, 2001   torn miniscus  . LUMBAR DISC SURGERY     X 3 with hardware  . NASAL TURBINATE REDUCTION Bilateral 10/26/2015   Procedure: TURBINATE REDUCTION/SUBMUCOSAL RESECTION;  Surgeon: Beverly Gust, MD;  Location: [facility];  Service: ENT;  Laterality: Bilateral;  . SEPTOPLASTY Bilateral 10/26/2015   Procedure: SEPTOPLASTY;  Surgeon: Beverly Gust, MD;  Location: [facility];  Service: ENT;  Laterality: Bilateral;    There were no vitals filed for this visit.  Subjective Assessment - 09/27/18 1056    Subjective  Patient reports he has a high  level of pain today, especially through his chest and thoracic spine. Reports this started about 3 hours after his last treatment session and has not gettoen better since. He reports he was in the bed 3.5 hours following last treatment session, 5 hours on Friday, most of the day sat and sunday. Reports pain above 10/10 upon arrival today with bilateral hand numbness and leg numbness as usual.    Pertinent History  See body of initial eval note for detailed history.    Limitations  Sitting;Lifting;Standing;Walking;House hold activities;Other (comment)    How long can you sit comfortably?  Sitting time: 1.5-2 hours uncomfortable.    How long can you stand comfortably?  Standing time: 1.5-2 hours uncomfortable    How long can you walk comfortably?  Walking: about 5 min or so (but variable), Half the grocery store pushing cart, uncomfortable.    Diagnostic tests  Imaging: MR Lumbar spine report 11/14/2018: "IMPRESSION: 1. Surgical changes from recent surgery with L3-4 posterior and interbody fusion and left partial facetectomy and left foraminotomy. No obvious complicating features such as hematoma. 2. Remote posterior and interbody fusion changes at L4-5 and L5-S1. 3. Normal appearance of L1-2 and L2-3." MR C-spine report 10/30/2016: "IMPRESSION: Mild spinal stenosis and mild foraminal stenosis bilaterally C3-4 due to spurring  unchanged. Progression of left foraminal disc protrusion at C5-6 with associated spurring. Expected compression left C6 nerve root. ACDF C6-7."    Patient Stated Goals  to return to PLOF and decrease pain; to be approved for disability funding until he is able to return to work. Legs buckle once or twice a day.    Currently in Pain?  Yes    Pain Score  10-Worst pain ever    Pain Location  Back    Pain Type  Chronic pain    Pain Radiating Towards  BUE numbness L > R. B LE numbness L > R.    Pain Onset  More than a month ago         TREATMENT Used to like to play soft ball and  volley ball but has not played in years.    Therapeutic exercise: to centralize symptoms and improve ROM, strength, muscular endurance, and activity tolerance required for successful completion of functional activities.  (manual therapy - see below) - Sidelying open book (thoracic rotation) to improve thoracic, shoulder girdle, and upper trunk mobility. Required instruction for technique and cuing to achieve end range as tolerated, hold time, and breathing technique. 5 second holds. 5-10 each side. More difficult with rotation to the R, painful in low back. Discontinued.  - seated thoracic rotation with legs braced on corner of plinth x 5-10 each side, using self overpressure as tolerated. Discontinued when continued to be painful.  - seated repeated thoracic extension over back of chair with clinician OP. Hands positioned behind neck to support head, elbows adducted to eliminate limitation due to pec/chest tightness. patient tolerated well during, but had increase in lumbar spine pain following. Reported chest pain and thoracic pain better initially.     Manual therapy:to reduce pain and tissue tension, improve range of motion, neuromodulation, in order to promote improved ability to complete functional activities.  - hooklying HVLA thrust through shoulders to mid, low, and upper thoracic spine with wedge positioned at targeted levels (T5, T7,T9) to relieve thoracic and chest pain. Cavitation noted at lower levels with mild dcerase in thoracic/chest pain.  - seated thoracic and CT joint HVLA distraction targeting thoracic spine and CT junction, no significant further improvement in thoracic/chest pain compared to hooklying position.   Supine with legs propped on bolster, no pillow under head: -light STM to bilateral posterior cervical spine musculature to help relax muscles in preparation for manual cervical distraction  - manual distraction to cervical spine x 5 x 30 second- 1 minute. Minimal  effects reported by patient.  - distraction with R then L cervical side-bend stretch. L side-bend decreased paresthesia in R hand and did not increase symptoms in L UE.  - manual distraction to the lumbar spine through long axis distraction of the hips. 2x60 secondsthrough each leg using mob belt to securely pull from ankle with hip midway between open and close pack position. Patient reported feeling it in his lumbar spine and that it improved pain there.     PT Education - 09/27/18 1141    Education Details  Exercise purpose/form, pain and paresthesia    Person(s) Educated  Patient    Methods  Explanation;Demonstration;Verbal cues    Comprehension  Verbalized understanding;Returned demonstration       PT Short Term Goals - 09/06/18 1054      PT SHORT TERM GOAL #1   Title  Be independent with initial home exercise program for self-management of symptoms.    Baseline  Initial  HEP to be provided at visit 2 (08/04/2018); pt provided with initial HEP, delayed due to lack of tolerance to activity (08/16/2018); pt reports his participation is "hit or miss" right now (09/06/2018);    Time  2    Period  Weeks    Status  Partially Met    Target Date  08/18/18        PT Long Term Goals - 09/06/18 1054      PT LONG TERM GOAL #1   Title  Be independent with a long-term home exercise program for self-management of symptoms.    Baseline  Initial HEP to provided at visit 2 (08/04/2018); pt reports his participation is "hit or miss" right now (09/06/2018);    Time  8    Period  Weeks    Status  Partially Met    Target Date  11/01/18      PT LONG TERM GOAL #2   Title  Demonstrate improved FOTO score by 10 units to demonstrate improvement in overall condition and self-reported functional ability.    Baseline  FOTO = 37 (08/04/2018); FOTO = 38 (09/06/2018);    Time  8    Period  Weeks    Status  Partially Met    Target Date  11/01/18      PT LONG TERM GOAL #3   Title  Be able to squat to chair height  with proper form without limitation due to current condition in order to improve ability to lift and complete transfers during usual and work activities.    Baseline  see objective exam (08/04/2018); improving but still very limited see objective exam (09/06/2018);    Time  8    Period  Weeks    Status  Partially Met    Target Date  11/01/18      PT LONG TERM GOAL #4   Title  Patient will demonstrate B UE and LE strength 4+/5 or higher to demonstrate functional strength for improved gait, increased standing tolerance, reaching, lifting, carrying and work activities.    Baseline  see objective exam (08/04/2018); improving but still limited, see objective exam (09/06/2018);    Time  8    Period  Weeks    Status  Partially Met    Target Date  11/01/18      PT LONG TERM GOAL #5   Title  Be able to demonstrate floor to waist lift with proper form without limitation due to current condition in order to improve ability to lift during usual and work activities.    Baseline  see objective exam (08/04/2018); used similar technique to previously, squat is slightly deeper see objective exam (09/06/2018);    Time  8    Period  Weeks    Status  On-going    Target Date  11/01/18      PT LONG TERM GOAL #6   Title  Patient will be able to return to full time employment without limitaiton due to current condition.    Baseline  currently unable to tolerate work activities - see subjective and objective exam (08/04/2018); currently unable to tolerate work activities - see subjective and objective exam (09/06/2018);    Time  8    Period  Weeks    Status  On-going    Target Date  11/01/18            Plan - 09/27/18 1142    Clinical Impression Statement  Patient tolerated treatment fair but continued to have high  levels of pain throughout treatment session. Able to fully close R hand upon completion of the session, which he states he could not do upon arrival. Continues to have significant lumbar pain and states L  LE feels like it will give out on him (states it has approx 6 times since last treatment session). Reports continued chest pain that is improved from start of visit. Reports he plans to go to chiropractor next to see if he can get further relief. Patient continues to struggle with limitations related to widespread pain, paresthesia, and the sensation of weakness. Patient able to ambulate with his cane independently and declined any assistance to reach his car. Patient continues to show abberent movement patterns when sitting and rising. Patient would benefit from continued physical therapy to address remaining impairments and functional limitations to work towards stated goals and return to PLOF or maximal functional independence.    Personal Factors and Comorbidities  Time since onset of injury/illness/exacerbation;Past/Current Experience;Comorbidity 3+    Comorbidities  HTN, hx of hernia repair, migraines, degenerative disc disease in cervical and lumbar spine, hyperlipidemia, at least 4 lumbar surgeries (last in September 2019 that included L3-4 posterior and interbody fusion and left partial facetectomy and left foraminotomy. Previous surgeries included interbody fusion at L4-5 and L5-S1), past cervical spine surgery (ACDF C6-7), multiple spinal injections, hx hernia repair x 2, hx of bilateral septoplasty and nasal turbinate reduction, hx of R knee surgeries for torn meniscus.    Examination-Activity Limitations  Bathing;Hygiene/Grooming;Squat;Stairs;Lift;Bed Mobility;Bend;Stand;Toileting;Carry;Transfers;Sit;Dressing    Examination-Participation Restrictions  Cleaning;Laundry;Shop;Volunteer;Community Activity;Yard Work;Driving;Interpersonal Relationship    Rehab Potential  Fair    PT Frequency  2x / week    PT Duration  8 weeks    PT Treatment/Interventions  ADLs/Self Care Home Management;Aquatic Therapy;Cryotherapy;Electrical Stimulation;Moist Heat;Therapeutic activities;Therapeutic  exercise;Neuromuscular re-education;Patient/family education;Manual techniques;Passive range of motion;Dry needling;Joint Manipulations;Spinal Manipulations;Other (comment)    PT Next Visit Plan  graded funcitonal exercise as tolerated, pain control; add cervical spine to treatment as able    PT Home Exercise Plan  Sciatic Nerve Flossing    Consulted and Agree with Plan of Care  Patient       Patient will benefit from skilled therapeutic intervention in order to improve the following deficits and impairments:  Decreased endurance, Decreased mobility, Difficulty walking, Hypomobility, Increased muscle spasms, Impaired sensation, Decreased range of motion, Decreased scar mobility, Impaired perceived functional ability, Improper body mechanics, Decreased activity tolerance, Decreased strength, Impaired flexibility, Impaired UE functional use, Pain  Visit Diagnosis: Chronic bilateral low back pain with bilateral sciatica  Paresthesia of skin  Muscle weakness (generalized)  Difficulty in walking, not elsewhere classified  Cervicalgia     Problem List Patient Active Problem List   Diagnosis Date Noted  . 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 Ruptured lumbar disc 10/09/2017  . Rhinitis medicamentosa 07/06/2015  . DDD (degenerative disc disease), lumbar 07/20/2014  . Hyperlipidemia 07/20/2014  . Hypertension 07/20/2014  . DDD (degenerative disc disease), cervical 07/20/2014  . Umbilical hernia 42/87/6811    Everlean Alstrom. Graylon Good, PT, DPT 09/27/18, 11:44 AM  [facility] [address] [place], [address] Phone: [phone]   Fax:  [phone]  Name: JAQWON MANFRED MRN: [id number] Date of Birth: 1970/02/24

## 2018-09-29 ENCOUNTER — Other Ambulatory Visit: Payer: Self-pay

## 2018-09-29 ENCOUNTER — Encounter: Payer: Self-pay | Admitting: Physical Therapy

## 2018-09-29 ENCOUNTER — Ambulatory Visit: Payer: BLUE CROSS/BLUE SHIELD | Admitting: Physical Therapy

## 2018-09-29 DIAGNOSIS — M6281 Muscle weakness (generalized): Secondary | ICD-10-CM

## 2018-09-29 DIAGNOSIS — M5442 Lumbago with sciatica, left side: Secondary | ICD-10-CM

## 2018-09-29 DIAGNOSIS — M542 Cervicalgia: Secondary | ICD-10-CM

## 2018-09-29 DIAGNOSIS — R262 Difficulty in walking, not elsewhere classified: Secondary | ICD-10-CM

## 2018-09-29 DIAGNOSIS — G8929 Other chronic pain: Secondary | ICD-10-CM

## 2018-09-29 DIAGNOSIS — R202 Paresthesia of skin: Secondary | ICD-10-CM

## 2018-09-29 NOTE — Therapy (Signed)
[facility] [address], [place], [address] Phone: [phone]   Fax:  [phone]  Physical Therapy Treatment  Patient Details  Name: [name] MRN: [id number] Date of Birth: 1970/08/21 Referring Provider (PT): Haywood Lasso   Encounter Date: 09/29/2018  PT End of Session - 09/29/18 1412    Visit Number  17    Number of Visits  26    Date for PT Re-Evaluation  11/01/18    Authorization Type  [employer] reporting period from 09/06/2018)    Authorization Time Period  Current cert period: 02/05/2438- 11/01/2018  (latest PN: 09/06/2018);    Authorization - Visit Number  7    Authorization - Number of Visits  10    PT Start Time  1030    PT Stop Time  1130    PT Time Calculation (min)  60 min    Activity Tolerance  Patient limited by pain;Patient tolerated treatment well    Behavior During Therapy  [facility] for tasks assessed/performed;Flat affect       Past Medical History:  Diagnosis Date  . DDD (degenerative disc disease), lumbar   . Dental bridge present    permanant - top left  . Headache    migraines - pinched nerve in neck - no mvmt limitations  . Hernia July 1027   umbilical  . Hypertension     Past Surgical History:  Procedure Laterality Date  . BACK SURGERY  2003, 2007, 2009, 2012   Dr Glenna Fellows  . HERNIA REPAIR  2536   umbilical hernia  . KNEE SURGERY Right 1997, 2001   torn miniscus  . LUMBAR DISC SURGERY     X 3 with hardware  . NASAL TURBINATE REDUCTION Bilateral 10/26/2015   Procedure: TURBINATE REDUCTION/SUBMUCOSAL RESECTION;  Surgeon: Beverly Gust, MD;  Location: [facility];  Service: ENT;  Laterality: Bilateral;  . SEPTOPLASTY Bilateral 10/26/2015   Procedure: SEPTOPLASTY;  Surgeon: Beverly Gust, MD;  Location: [facility];  Service: ENT;  Laterality: Bilateral;    There were no vitals filed for this visit.  Subjective Assessment - 09/29/18 1032    Subjective   Patient reports he is feeling a bit better today about 8/10. Both hands are going numb. This morning he had a sharp pain he has never had the upper part of the buttocks cheek on the R side and up to his back "like a dagger." States he went straight home to bed following last treatment session for several hours, then got up and went to chiropractor who did thrust thoracic manipulation in prone that improved his thoracic and chest pain. Yesterday he spent the day in bed except going out with his son to look at a potential new vehicle that his son was considering purchasing. Patient states he gets in the bed when he feels like he can't go any further. States today he has worst pain at upper back pain near distal scapula. B legs feel "there" but that's it. L leg feels like cramping in the back at times. Would like to do nustep today.    Pertinent History  See body of initial eval note for detailed history.    Limitations  Sitting;Lifting;Standing;Walking;House hold activities;Other (comment)    How long can you sit comfortably?  Sitting time: 1.5-2 hours uncomfortable.    How long can you stand comfortably?  Standing time: 1.5-2 hours uncomfortable    How long can you walk comfortably?  Walking:  about 5 min or so (but variable), Half the grocery store pushing cart, uncomfortable.    Diagnostic tests  Imaging: MR Lumbar spine report 11/14/2018: "IMPRESSION: 1. Surgical changes from recent surgery with L3-4 posterior and interbody fusion and left partial facetectomy and left foraminotomy. No obvious complicating features such as hematoma. 2. Remote posterior and interbody fusion changes at L4-5 and L5-S1. 3. Normal appearance of L1-2 and L2-3." MR C-spine report 10/30/2016: "IMPRESSION: Mild spinal stenosis and mild foraminal stenosis bilaterally C3-4 due to spurring unchanged. Progression of left foraminal disc protrusion at C5-6 with associated spurring. Expected compression left C6 nerve root. ACDF C6-7."     Patient Stated Goals  to return to PLOF and decrease pain; to be approved for disability funding until he is able to return to work. Legs buckle once or twice a day.    Currently in Pain?  Yes    Pain Score  8     Pain Onset  More than a month ago        TREATMENT Used to like to play soft ball and volley ball but has not played in years.   Therapeutic exercise:to centralize symptoms and improve ROM, strength, muscular endurance, and activity tolerance required for successful completion of functional activities. -NuStep level2 using bilateral lower extremitiesonly.Seat setting 10. For improved extremity mobility, muscular endurance, and activity tolerance; and to induce the analgesic effect of aerobic exercise, stimulate improved joint nutrition, and prepare body structures and systems for following interventions. during subjective exam.Increased pain at L groin initially. .55mles in 5:41 min. Average SPM =84. - leg machineknee extension at 15#x10, x10, 2x5reps legs working together.  Rated "pretty tough today"difficulty. - leg curl machine 25# 2x10 reps. ratedmediumdifficulty. Reports legs went numb.  - seated leg press R x 4 reps at 25#, dropped to 22# then 2x10each side. Seatedposition 2 from max distance. Increased pain on R with increased hip flexion but pt wanted to push into it some after being cued to stop prior to pain. Rated high difficulty.  - seated rows at press machinex20at15# self-selected weight.No reports of increased pain. keeping chest off support throughout.Ratedmedium - hard difficulty. Produced significantly increased pain at mid thoracic spine. - seated lat pull x20at 20#. L shoulder didn't hurt as bad. Rated medium difficulty.  L shoulder "feeling pretty good." - seated (at weight machine facing out) bicep curls 2x10 on each side 5# dumbbell with light grip. Difficulty rated medium-hard.feels like he will drop the DB when nears 10 reps.  -  seated (at cable machine facing out) tricep extensions 5# on cable (chain + double arm rowing attachment anchored overhead - pt holds both handles in one hand), x20 each side. Rated medium difficulty.  - Standing cervical thoracic extension/BUE flexion and serratus anterior activation, lat stretch, with foam roller up wall, 1 second holds, cuing for technique. x10 plus time for instruction, transitions, and appropriate rest.  - Supine lower trunk rotation without theraball x 2 min, then with 55cm theraball under lower extremities, 3-5 second holds, x 2 minutes. - Supine double knees to chest with 55cm therabal under feet, 3-5 second holds. 5 second holds x 2 minutes plus time for transition and instructions to complete activity correctly. - sidelying clam shell with self-palpation at pelvis to prevent compensatory rotation. X 20 each side.  (manual therapy - see below)  Manual therapy:to reduce pain and tissue tension, improve range of motion, neuromodulation, in order to promote improved ability to complete functional activities.  Hooklying with  pillow: - manual distraction to the lumbar spine through long axis distraction of the hips. x60 seconds through each leg using mob belt to securely pull from ankle with hip midway between open and close pack position. Patient reported feeling it in his lumbar spine and that it improved pain there.   Prone with no pillows under abdomen head in flat cradle, and pillow under feet:  - CPA grade III along thoracic spine with several pain relieving cavitations. Grade Vx2 along mid-thoracic spine without further cavitation.  HOME EXERCISE PROGRAM Access Code: [email or web address]  URL: [email or web address]  Date: 09/29/2018  Prepared by: Rosita Kea   Exercises  Supine Lower Trunk Rotation - 40 reps - 2x daily - 7x weekly  Supine LE Neural Mobilization - 15 reps - 2x daily - 7x weekly     PT Education - 09/29/18 1411    Education Details   Exercise purpose/form,    Person(s) Educated  Patient    Methods  Explanation;Demonstration;Tactile cues;Verbal cues    Comprehension  Verbalized understanding;Returned demonstration       PT Short Term Goals - 09/06/18 1054      PT SHORT TERM GOAL #1   Title  Be independent with initial home exercise program for self-management of symptoms.    Baseline  Initial HEP to be provided at visit 2 (08/04/2018); pt provided with initial HEP, delayed due to lack of tolerance to activity (08/16/2018); pt reports his participation is "hit or miss" right now (09/06/2018);    Time  2    Period  Weeks    Status  Partially Met    Target Date  08/18/18        PT Long Term Goals - 09/06/18 1054      PT LONG TERM GOAL #1   Title  Be independent with a long-term home exercise program for self-management of symptoms.    Baseline  Initial HEP to provided at visit 2 (08/04/2018); pt reports his participation is "hit or miss" right now (09/06/2018);    Time  8    Period  Weeks    Status  Partially Met    Target Date  11/01/18      PT LONG TERM GOAL #2   Title  Demonstrate improved FOTO score by 10 units to demonstrate improvement in overall condition and self-reported functional ability.    Baseline  FOTO = 37 (08/04/2018); FOTO = 38 (09/06/2018);    Time  8    Period  Weeks    Status  Partially Met    Target Date  11/01/18      PT LONG TERM GOAL #3   Title  Be able to squat to chair height with proper form without limitation due to current condition in order to improve ability to lift and complete transfers during usual and work activities.    Baseline  see objective exam (08/04/2018); improving but still very limited see objective exam (09/06/2018);    Time  8    Period  Weeks    Status  Partially Met    Target Date  11/01/18      PT LONG TERM GOAL #4   Title  Patient will demonstrate B UE and LE strength 4+/5 or higher to demonstrate functional strength for improved gait, increased standing tolerance,  reaching, lifting, carrying and work activities.    Baseline  see objective exam (08/04/2018); improving but still limited, see objective exam (09/06/2018);    Time  8    Period  Weeks    Status  Partially Met    Target Date  11/01/18      PT LONG TERM GOAL #5   Title  Be able to demonstrate floor to waist lift with proper form without limitation due to current condition in order to improve ability to lift during usual and work activities.    Baseline  see objective exam (08/04/2018); used similar technique to previously, squat is slightly deeper see objective exam (09/06/2018);    Time  8    Period  Weeks    Status  On-going    Target Date  11/01/18      PT LONG TERM GOAL #6   Title  Patient will be able to return to full time employment without limitaiton due to current condition.    Baseline  currently unable to tolerate work activities - see subjective and objective exam (08/04/2018); currently unable to tolerate work activities - see subjective and objective exam (09/06/2018);    Time  8    Period  Weeks    Status  On-going    Target Date  11/01/18            Plan - 09/29/18 1420    Clinical Impression Statement  Patient tolerated treatment well, reporting decreased pain in thoracic spine following manual therapy but stated his low back was still very painful by end of session. Was able to tolerate return to graded exercise. Updated HEP but patient left without handout. Suggest providing it next session. Patient continues to struggle with limitations related to widespread pain, paresthesia, and the sensation of weakness. Patient continues to show abberent movement patterns when sitting and rising. Patient would benefit from continued physical therapy to address remaining impairments and functional limitations to work towards stated goals and return to PLOF or maximal functional independence.    Personal Factors and Comorbidities  Time since onset of injury/illness/exacerbation;Past/Current  Experience;Comorbidity 3+    Comorbidities  HTN, hx of hernia repair, migraines, degenerative disc disease in cervical and lumbar spine, hyperlipidemia, at least 4 lumbar surgeries (last in September 2019 that included L3-4 posterior and interbody fusion and left partial facetectomy and left foraminotomy. Previous surgeries included interbody fusion at L4-5 and L5-S1), past cervical spine surgery (ACDF C6-7), multiple spinal injections, hx hernia repair x 2, hx of bilateral septoplasty and nasal turbinate reduction, hx of R knee surgeries for torn meniscus.    Examination-Activity Limitations  Bathing;Hygiene/Grooming;Squat;Stairs;Lift;Bed Mobility;Bend;Stand;Toileting;Carry;Transfers;Sit;Dressing    Examination-Participation Restrictions  Cleaning;Laundry;Shop;Volunteer;Community Activity;Yard Work;Driving;Interpersonal Relationship    Rehab Potential  Fair    PT Frequency  2x / week    PT Duration  8 weeks    PT Treatment/Interventions  ADLs/Self Care Home Management;Aquatic Therapy;Cryotherapy;Electrical Stimulation;Moist Heat;Therapeutic activities;Therapeutic exercise;Neuromuscular re-education;Patient/family education;Manual techniques;Passive range of motion;Dry needling;Joint Manipulations;Spinal Manipulations;Other (comment)    PT Next Visit Plan  graded funcitonal exercise as tolerated, pain control; add cervical spine to treatment as able    PT Home Exercise Plan  Sciatic Nerve Flossing    Consulted and Agree with Plan of Care  Patient       Patient will benefit from skilled therapeutic intervention in order to improve the following deficits and impairments:  Decreased endurance, Decreased mobility, Difficulty walking, Hypomobility, Increased muscle spasms, Impaired sensation, Decreased range of motion, Decreased scar mobility, Impaired perceived functional ability, Improper body mechanics, Decreased activity tolerance, Decreased strength, Impaired flexibility, Impaired UE functional use,  Pain  Visit Diagnosis: Chronic bilateral low back pain with bilateral sciatica  Paresthesia of skin  Muscle weakness (generalized)  Difficulty in walking, not elsewhere classified  Cervicalgia     Problem List Patient Active Problem List   Diagnosis Date Noted  . 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 Ruptured lumbar disc 10/09/2017  . Rhinitis medicamentosa 07/06/2015  . DDD (degenerative disc disease), lumbar 07/20/2014  . Hyperlipidemia 07/20/2014  . Hypertension 07/20/2014  . DDD (degenerative disc disease), cervical 07/20/2014  . Umbilical hernia 74/01/8785    Everlean Alstrom. Graylon Good, PT, DPT 09/29/18, 2:20 PM  [facility] [address] [place], [address] Phone: [phone]   Fax:  [phone]  Name: BEULAH MATUSEK MRN: [id number] Date of Birth: 23-May-1970

## 2018-10-06 ENCOUNTER — Encounter: Payer: Self-pay | Admitting: Physical Therapy

## 2018-10-06 ENCOUNTER — Ambulatory Visit: Payer: BLUE CROSS/BLUE SHIELD | Attending: Family Medicine | Admitting: Physical Therapy

## 2018-10-06 ENCOUNTER — Other Ambulatory Visit: Payer: Self-pay

## 2018-10-06 DIAGNOSIS — M5442 Lumbago with sciatica, left side: Secondary | ICD-10-CM | POA: Insufficient documentation

## 2018-10-06 DIAGNOSIS — M5441 Lumbago with sciatica, right side: Secondary | ICD-10-CM | POA: Diagnosis present

## 2018-10-06 DIAGNOSIS — M6281 Muscle weakness (generalized): Secondary | ICD-10-CM | POA: Insufficient documentation

## 2018-10-06 DIAGNOSIS — R202 Paresthesia of skin: Secondary | ICD-10-CM | POA: Insufficient documentation

## 2018-10-06 DIAGNOSIS — R262 Difficulty in walking, not elsewhere classified: Secondary | ICD-10-CM | POA: Diagnosis present

## 2018-10-06 DIAGNOSIS — M542 Cervicalgia: Secondary | ICD-10-CM | POA: Insufficient documentation

## 2018-10-06 DIAGNOSIS — G8929 Other chronic pain: Secondary | ICD-10-CM | POA: Diagnosis present

## 2018-10-06 NOTE — Therapy (Addendum)
[facility] [address], [place], [address] Phone: [phone]   Fax:  [phone]  Physical Therapy Treatment  Patient Details  Name: [name] MRN: [id number] Date of Birth: 02-11-1970 Referring Provider (PT): Haywood Lasso   Encounter Date: 10/06/2018  PT End of Session - 10/06/18 1046    Visit Number  18    Number of Visits  26    Date for PT Re-Evaluation  11/01/18    Authorization Type  [employer] reporting period from 09/06/2018)    Authorization Time Period  Current cert period: 09/04/5001- 11/01/2018  (latest PN: 09/06/2018);    Authorization - Visit Number  8    Authorization - Number of Visits  10    PT Start Time  7048    PT Stop Time  1114    PT Time Calculation (min)  45 min    Activity Tolerance  Patient limited by pain    Behavior During Therapy  [facility] for tasks assessed/performed;Flat affect       Past Medical History:  Diagnosis Date  . DDD (degenerative disc disease), lumbar   . Dental bridge present    permanant - top left  . Headache    migraines - pinched nerve in neck - no mvmt limitations  . Hernia July 8891   umbilical  . Hypertension     Past Surgical History:  Procedure Laterality Date  . BACK SURGERY  2003, 2007, 2009, 2012   Dr Glenna Fellows  . HERNIA REPAIR  6945   umbilical hernia  . KNEE SURGERY Right 1997, 2001   torn miniscus  . LUMBAR DISC SURGERY     X 3 with hardware  . NASAL TURBINATE REDUCTION Bilateral 10/26/2015   Procedure: TURBINATE REDUCTION/SUBMUCOSAL RESECTION;  Surgeon: Beverly Gust, MD;  Location: [facility];  Service: ENT;  Laterality: Bilateral;  . SEPTOPLASTY Bilateral 10/26/2015   Procedure: SEPTOPLASTY;  Surgeon: Beverly Gust, MD;  Location: [facility];  Service: ENT;  Laterality: Bilateral;    There were no vitals filed for this visit.  Subjective Assessment - 10/06/18 1034    Subjective  Patient reports he is feeling  pretty good today without any chest pain. reports he continues to have numbness in B UE and BLE. Reports L shoulder pain is present today.    Pertinent History  See body of initial eval note for detailed history.    Limitations  Sitting;Lifting;Standing;Walking;House hold activities;Other (comment)    How long can you sit comfortably?  Sitting time: 1.5-2 hours uncomfortable.    How long can you stand comfortably?  Standing time: 1.5-2 hours uncomfortable    How long can you walk comfortably?  Walking: about 5 min or so (but variable), Half the grocery store pushing cart, uncomfortable.    Diagnostic tests  Imaging: MR Lumbar spine report 11/14/2018: "IMPRESSION: 1. Surgical changes from recent surgery with L3-4 posterior and interbody fusion and left partial facetectomy and left foraminotomy. No obvious complicating features such as hematoma. 2. Remote posterior and interbody fusion changes at L4-5 and L5-S1. 3. Normal appearance of L1-2 and L2-3." MR C-spine report 10/30/2016: "IMPRESSION: Mild spinal stenosis and mild foraminal stenosis bilaterally C3-4 due to spurring unchanged. Progression of left foraminal disc protrusion at C5-6 with associated spurring. Expected compression left C6 nerve root. ACDF C6-7."    Patient Stated Goals  to return to PLOF and decrease pain; to be approved for disability funding until he is able  to return to work. Legs buckle once or twice a day.    Currently in Pain?  Yes    Pain Score  7     Pain Location  Back    Pain Onset  More than a month ago    Pain Frequency  Constant      TREATMENT Used to like to play soft ball and volley ball but has not played in years.  Therapeutic exercise:to centralize symptoms and improve ROM, strength, muscular endurance, and activity tolerance required for successful completion of functional activities. - leg curl machine 25# 20 reps.Ratedmedium/harddifficulty. Reports legs went numb.  - leg machineknee extension.  Attempted 20#/25# and pt states it's too difficulty. Adjusted weight to 15# 1x10, 2x5reps legs working together.Rated "hard"difficulty.  - Seated rows at press machinex20at15# self-selected weight.No reports of increased pain.Bracing trunk against the chest pad from the press machine.Ratedmedium - hard difficulty. Produced significantly increased pain at mid thoracic spine. - Seated lat pull x20at 20#.Rated medium difficulty. Patient reports increase both side of shoulder pain.  - Seated (at weight machine facing out) bicep curls 2x10 on each side6# dumbbell with light grip. Difficulty rated medium hard. Still reports he feels like he will drop the DB when nears 10 reps.   - Seated (at cable machine facing out) tricep extensions 5# on cable (chain + double arm rowing attachment anchored overhead - pt holds both handles in one hand), x20 each side. Ratedmediumdifficulty. - seated leg press 22#,2x20each side. Seatedposition 2 from max distance.  Rated high difficulty. Pain increased from low back through R hip/knee per patient.  (manual therapy - see below) -Sidelying with pillow between BLE knee open book (thoracic rotation) to improve thoracic, shoulder girdle, and upper trunk mobility. Required instruction for technique and cuing to achieve end range as tolerated, hold time, and breathing technique. 10 on each side. Patient reports increased pain from this.    Manual therapy:to reduce pain and tissue tension, improve range of motion, neuromodulation, in order to promote improved ability to complete functional activities.  Supine with blue bolster under BLE for comfort, no pillow.  - manual cervical retraction  extension with distraction x 1 (increased paresthesia in L hand, resolved when discontinued), attempted again after other manual techniques (below) and was able to completed x 6 without any increase or decrease in pain or paresthesia.  - L sidebending  2x10 with mild  distraction and blocking at mid cervical spine as tolerated with gentle overpressure, well tolerated. Repeated on R x 10. Pt reported no pain in neck or either shoulder or UE upon completion.  -light STM to bilateral posterior cervical spine musculature to help relax muscles in preparation for end range stretching and overpressure techniques.   Prone with no pillows under abdomen head in flat cradle, and pillow under feet:  - CPA grade III along thoracic spine with several pain relieving cavitations. Grade Vx8 along mid-thoracic spine without further cavitation.    HOME EXERCISE PROGRAM Access Code: [email or web address]  URL: [email or web address]  Date: 09/29/2018  Prepared by: Rosita Kea   Exercises   Supine Lower Trunk Rotation - 40 reps - 2x daily - 7x weekly   Supine LE Neural Mobilization - 15 reps - 2x daily - 7x weekly     PT Education - 10/06/18 1045    Education Details  Exercise purpose/form    Person(s) Educated  Patient    Methods  Explanation;Demonstration;Tactile cues;Verbal cues    Comprehension  Returned demonstration;Verbalized understanding;Verbal cues required;Tactile cues  required       PT Short Term Goals - 09/06/18 1054      PT SHORT TERM GOAL #1   Title  Be independent with initial home exercise program for self-management of symptoms.    Baseline  Initial HEP to be provided at visit 2 (08/04/2018); pt provided with initial HEP, delayed due to lack of tolerance to activity (08/16/2018); pt reports his participation is "hit or miss" right now (09/06/2018);    Time  2    Period  Weeks    Status  Partially Met    Target Date  08/18/18        PT Long Term Goals - 09/06/18 1054      PT LONG TERM GOAL #1   Title  Be independent with a long-term home exercise program for self-management of symptoms.    Baseline  Initial HEP to provided at visit 2 (08/04/2018); pt reports his participation is "hit or miss" right now (09/06/2018);    Time  8    Period  Weeks     Status  Partially Met    Target Date  11/01/18      PT LONG TERM GOAL #2   Title  Demonstrate improved FOTO score by 10 units to demonstrate improvement in overall condition and self-reported functional ability.    Baseline  FOTO = 37 (08/04/2018); FOTO = 38 (09/06/2018);    Time  8    Period  Weeks    Status  Partially Met    Target Date  11/01/18      PT LONG TERM GOAL #3   Title  Be able to squat to chair height with proper form without limitation due to current condition in order to improve ability to lift and complete transfers during usual and work activities.    Baseline  see objective exam (08/04/2018); improving but still very limited see objective exam (09/06/2018);    Time  8    Period  Weeks    Status  Partially Met    Target Date  11/01/18      PT LONG TERM GOAL #4   Title  Patient will demonstrate B UE and LE strength 4+/5 or higher to demonstrate functional strength for improved gait, increased standing tolerance, reaching, lifting, carrying and work activities.    Baseline  see objective exam (08/04/2018); improving but still limited, see objective exam (09/06/2018);    Time  8    Period  Weeks    Status  Partially Met    Target Date  11/01/18      PT LONG TERM GOAL #5   Title  Be able to demonstrate floor to waist lift with proper form without limitation due to current condition in order to improve ability to lift during usual and work activities.    Baseline  see objective exam (08/04/2018); used similar technique to previously, squat is slightly deeper see objective exam (09/06/2018);    Time  8    Period  Weeks    Status  On-going    Target Date  11/01/18      PT LONG TERM GOAL #6   Title  Patient will be able to return to full time employment without limitaiton due to current condition.    Baseline  currently unable to tolerate work activities - see subjective and objective exam (08/04/2018); currently unable to tolerate work activities - see subjective and objective exam  (09/06/2018);    Time  8    Period  Weeks    Status  On-going    Target Date  11/01/18            Plan - 10/06/18 1150    Clinical Impression Statement  Patient tolerated treatment well, reporting consistent pain throughout the session but able to tolerated graded session well with improved resistance level and repetitions. Patient provided updated HEP and handout given. Patient felt his pain was most elevated by open book exercise at end of session after standing up despite reporting the stretch felt somewhat good while doing it. Patient continues to struggle with limitations related to widespread pain, paresthesia, and the sensation of weakness. Patient continues to show abberent movement patterns when sitting and rising. Patient would benefit from continued physical therapy to address remaining impairments and functional limitations to work towards stated goals and return to PLOF or maximal functional independence.    Personal Factors and Comorbidities  Time since onset of injury/illness/exacerbation;Past/Current Experience;Comorbidity 3+    Comorbidities  HTN, hx of hernia repair, migraines, degenerative disc disease in cervical and lumbar spine, hyperlipidemia, at least 4 lumbar surgeries (last in September 2019 that included L3-4 posterior and interbody fusion and left partial facetectomy and left foraminotomy. Previous surgeries included interbody fusion at L4-5 and L5-S1), past cervical spine surgery (ACDF C6-7), multiple spinal injections, hx hernia repair x 2, hx of bilateral septoplasty and nasal turbinate reduction, hx of R knee surgeries for torn meniscus.    Examination-Activity Limitations  Bathing;Hygiene/Grooming;Squat;Stairs;Lift;Bed Mobility;Bend;Stand;Toileting;Carry;Transfers;Sit;Dressing    Examination-Participation Restrictions  Cleaning;Laundry;Shop;Volunteer;Community Activity;Yard Work;Driving;Interpersonal Relationship    Rehab Potential  Fair    PT Frequency  2x / week     PT Duration  8 weeks    PT Treatment/Interventions  ADLs/Self Care Home Management;Aquatic Therapy;Cryotherapy;Electrical Stimulation;Moist Heat;Therapeutic activities;Therapeutic exercise;Neuromuscular re-education;Patient/family education;Manual techniques;Passive range of motion;Dry needling;Joint Manipulations;Spinal Manipulations;Other (comment)    PT Next Visit Plan  graded funcitonal exercise as tolerated, pain control; add cervical spine to treatment as able    PT Home Exercise Plan  Sciatic Nerve Flossing    Consulted and Agree with Plan of Care  Patient       Patient will benefit from skilled therapeutic intervention in order to improve the following deficits and impairments:  Decreased endurance, Decreased mobility, Difficulty walking, Hypomobility, Increased muscle spasms, Impaired sensation, Decreased range of motion, Decreased scar mobility, Impaired perceived functional ability, Improper body mechanics, Decreased activity tolerance, Decreased strength, Impaired flexibility, Impaired UE functional use, Pain  Visit Diagnosis: Chronic bilateral low back pain with bilateral sciatica  Paresthesia of skin  Muscle weakness (generalized)  Difficulty in walking, not elsewhere classified  Cervicalgia     Problem List Patient Active Problem List   Diagnosis Date Noted  . 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 Ruptured lumbar disc 10/09/2017  . Rhinitis medicamentosa 07/06/2015  . DDD (degenerative disc disease), lumbar 07/20/2014  . Hyperlipidemia 07/20/2014  . Hypertension 07/20/2014  . DDD (degenerative disc disease), cervical 07/20/2014  . Umbilical hernia 08/81/1031   Student physical therapist under direct supervision of licensed physical therapists during the entirety of the session.   Everlean Alstrom. Graylon Good, PT, DPT 10/06/18, 9:06 PM  [facility] [address], [place], [address] Phone:  [phone]   Fax:  [phone]  Name: DARRELLE WIBERG MRN: [id number] Date of Birth: May 22, 1970

## 2018-10-12 ENCOUNTER — Ambulatory Visit: Payer: BLUE CROSS/BLUE SHIELD | Admitting: Physical Therapy

## 2018-10-14 ENCOUNTER — Other Ambulatory Visit: Payer: Self-pay

## 2018-10-14 ENCOUNTER — Ambulatory Visit: Payer: BLUE CROSS/BLUE SHIELD

## 2018-10-14 ENCOUNTER — Encounter: Payer: Self-pay | Admitting: Physical Therapy

## 2018-10-14 DIAGNOSIS — M5442 Lumbago with sciatica, left side: Secondary | ICD-10-CM | POA: Diagnosis not present

## 2018-10-14 DIAGNOSIS — M542 Cervicalgia: Secondary | ICD-10-CM

## 2018-10-14 DIAGNOSIS — G8929 Other chronic pain: Secondary | ICD-10-CM

## 2018-10-14 DIAGNOSIS — R262 Difficulty in walking, not elsewhere classified: Secondary | ICD-10-CM

## 2018-10-14 DIAGNOSIS — R202 Paresthesia of skin: Secondary | ICD-10-CM

## 2018-10-14 DIAGNOSIS — M6281 Muscle weakness (generalized): Secondary | ICD-10-CM

## 2018-10-14 NOTE — Therapy (Signed)
[facility] [address] [place], [address] Phone: [phone]   Fax:  [phone]  Physical Therapy Treatment  Patient Details  Name: [name] MRN: [id number] Date of Birth: 01-08-1971 Referring Provider (PT): Haywood Lasso   Encounter Date: 10/14/2018  PT End of Session - 10/14/18 1119    Visit Number  19    Number of Visits  26    Date for PT Re-Evaluation  11/01/18    Authorization Type  [employer] reporting period from 09/06/2018)    Authorization Time Period  Current cert period: 04/10/3426- 11/01/2018  (latest PN: 09/06/2018);    Authorization - Visit Number  9    Authorization - Number of Visits  10    PT Start Time  7681    PT Stop Time  1210    PT Time Calculation (min)  55 min    Activity Tolerance  Patient limited by pain    Behavior During Therapy  [facility] for tasks assessed/performed;Flat affect       Past Medical History:  Diagnosis Date  . DDD (degenerative disc disease), lumbar   . Dental bridge present    permanant - top left  . Headache    migraines - pinched nerve in neck - no mvmt limitations  . Hernia July 1572   umbilical  . Hypertension     Past Surgical History:  Procedure Laterality Date  . BACK SURGERY  2003, 2007, 2009, 2012   Dr Glenna Fellows  . HERNIA REPAIR  6203   umbilical hernia  . KNEE SURGERY Right 1997, 2001   torn miniscus  . LUMBAR DISC SURGERY     X 3 with hardware  . NASAL TURBINATE REDUCTION Bilateral 10/26/2015   Procedure: TURBINATE REDUCTION/SUBMUCOSAL RESECTION;  Surgeon: Beverly Gust, MD;  Location: [facility];  Service: ENT;  Laterality: Bilateral;  . SEPTOPLASTY Bilateral 10/26/2015   Procedure: SEPTOPLASTY;  Surgeon: Beverly Gust, MD;  Location: [facility];  Service: ENT;  Laterality: Bilateral;    There were no vitals filed for this visit.  Subjective Assessment - 10/14/18 1118    Subjective  Patient stated he saw a  neurosurgeon this week but is unhappy about results, told him they did not think he was a surgical candidate. Pt with improved ambluation this session but had significant pain yesterday.    Pertinent History  See body of initial eval note for detailed history.    Limitations  Sitting;Lifting;Standing;Walking;House hold activities;Other (comment)    How long can you sit comfortably?  Sitting time: 1.5-2 hours uncomfortable.    How long can you stand comfortably?  Standing time: 1.5-2 hours uncomfortable    How long can you walk comfortably?  Walking: about 5 min or so (but variable), Half the grocery store pushing cart, uncomfortable.    Diagnostic tests  Imaging: MR Lumbar spine report 11/14/2018: "IMPRESSION: 1. Surgical changes from recent surgery with L3-4 posterior and interbody fusion and left partial facetectomy and left foraminotomy. No obvious complicating features such as hematoma. 2. Remote posterior and interbody fusion changes at L4-5 and L5-S1. 3. Normal appearance of L1-2 and L2-3." MR C-spine report 10/30/2016: "IMPRESSION: Mild spinal stenosis and mild foraminal stenosis bilaterally C3-4 due to spurring unchanged. Progression of left foraminal disc protrusion at C5-6 with associated spurring. Expected compression left C6 nerve root. ACDF C6-7."    Patient Stated Goals  to return to PLOF and decrease pain; to be approved for disability  funding until he is able to return to work. Legs buckle once or twice a day.    Currently in Pain?  Yes    Pain Score  8     Pain Location  Back    Pain Orientation  Mid;Lower    Pain Descriptors / Indicators  Constant;Dull    Pain Type  Chronic pain    Pain Onset  More than a month ago         TREATMENT Used to like to play soft ball and volley ball but has not played in years.  Therapeutic exercise:to centralize symptoms and improve ROM, strength, muscular endurance, and activity tolerance required for successful completion of functional  activities.  -NuStep level2using bilateral lower extremitiesonly.Seat setting 10. For improved extremity mobility, muscular endurance, and activity tolerance; and to induce the analgesic effect of aerobic exercise, stimulate improved joint nutrition, and prepare body structures and systems for following interventions. during subjective exam.Increased pain at L groin initially. .75mles in5:153m. Average SPM =90.  - leg curl machine 25#20reps.Ratedmedium/harddifficulty.Reports legs went numb.  - leg machineknee extension. Attempted 20#, too hard, adjusted to 15# 2x10 able to complete 2x10 with rest in between 10  - Seated rows at press machinex25at15#.No reports of increased pain.Bracing trunk against the chest pad from the press machine. Unable to grip handles he's afraid he'll drop it.Ratedmedium- hard difficulty. Cued for breathing.  - Seated lat pull x25at 20#.Rated medium difficulty.  - Seated(at weight machine facing out)bicep curls2x10 on each side6# dumbbell with light grip. Difficulty rated  hard, especially due to grip strength, Still reports he feels like he will drop the DB when nears 10 reps  - Seated(at cable machine facing out)tricep extensions 5# oncable (chain + double arm rowing attachment anchored overhead - pt holds both handles in one hand), x20 each side. Ratedmediumdifficulty, pt reported increased chest pain at end of reps.  -seated gripping with green putty, administered for home use, ~95m49mea hand to allow for explanation and demonstration of use    Manual therapy:to reduce pain and tissue tension, improve range of motion, neuromodulation, in order to promote improved ability to complete functional activities.   Supine with blue bolster under BLE for comfort, no pillow.  - manual cervical retraction in neutral 10x10sec holds, L side bend 10x5sec, R side bend 10x5sec, and in extended positione 3x10sec holds ( no reported change  in symptoms/pain)  -light STM to bilateral posterior cervical spine musculature to help relax muscles in preparation for end range stretching and overpressure techniques.  Prone withno pillows under abdomenhead in flat cradle, and pillow under feet:  -CPA grade III-IV  along thoracic spine, no cavitations noted this session  HOME EXERCISE PROGRAM Access Code: [email or web address]: [email or web address]  Date: 09/29/2018  Prepared by: SarRosita KeaExercises   Supine Lower Trunk Rotation - 40 reps - 2x daily - 7x weekly   Supine LE Neural Mobilization - 15 reps - 2x daily - 7x weekly   Pt response/clinical impression: The patient remains motivated to participate in strengthening program, but limited due to significant pain or increased numbness. Extra time needed for transfers on and off machines and mat, supervision-CGA to ensure safety. Often displays abberant movements and needs extended time prior to adjusting position, especially during sit to stand. Overall the patient would benefit from further skilled PT intervention to progress program as able [facility].       PT Education - 10/14/18  1119    Education Details  exercise purpose/form    Person(s) Educated  Patient    Methods  Explanation;Demonstration;Tactile cues;Verbal cues    Comprehension  Verbalized understanding;Returned demonstration;Verbal cues required;Tactile cues required       PT Short Term Goals - 09/06/18 1054      PT SHORT TERM GOAL #1   Title  Be independent with initial home exercise program for self-management of symptoms.    Baseline  Initial HEP to be provided at visit 2 (08/04/2018); pt provided with initial HEP, delayed due to lack of tolerance to activity (08/16/2018); pt reports his participation is "hit or miss" right now (09/06/2018);    Time  2    Period  Weeks    Status  Partially Met    Target Date  08/18/18        PT Long Term Goals -  09/06/18 1054      PT LONG TERM GOAL #1   Title  Be independent with a long-term home exercise program for self-management of symptoms.    Baseline  Initial HEP to provided at visit 2 (08/04/2018); pt reports his participation is "hit or miss" right now (09/06/2018);    Time  8    Period  Weeks    Status  Partially Met    Target Date  11/01/18      PT LONG TERM GOAL #2   Title  Demonstrate improved FOTO score by 10 units to demonstrate improvement in overall condition and self-reported functional ability.    Baseline  FOTO = 37 (08/04/2018); FOTO = 38 (09/06/2018);    Time  8    Period  Weeks    Status  Partially Met    Target Date  11/01/18      PT LONG TERM GOAL #3   Title  Be able to squat to chair height with proper form without limitation due to current condition in order to improve ability to lift and complete transfers during usual and work activities.    Baseline  see objective exam (08/04/2018); improving but still very limited see objective exam (09/06/2018);    Time  8    Period  Weeks    Status  Partially Met    Target Date  11/01/18      PT LONG TERM GOAL #4   Title  Patient will demonstrate B UE and LE strength 4+/5 or higher to demonstrate functional strength for improved gait, increased standing tolerance, reaching, lifting, carrying and work activities.    Baseline  see objective exam (08/04/2018); improving but still limited, see objective exam (09/06/2018);    Time  8    Period  Weeks    Status  Partially Met    Target Date  11/01/18      PT LONG TERM GOAL #5   Title  Be able to demonstrate floor to waist lift with proper form without limitation due to current condition in order to improve ability to lift during usual and work activities.    Baseline  see objective exam (08/04/2018); used similar technique to previously, squat is slightly deeper see objective exam (09/06/2018);    Time  8    Period  Weeks    Status  On-going    Target Date  11/01/18      PT LONG TERM GOAL #6    Title  Patient will be able to return to full time employment without limitaiton due to current condition.    Baseline  currently  unable to tolerate work activities - see subjective and objective exam (08/04/2018); currently unable to tolerate work activities - see subjective and objective exam (09/06/2018);    Time  8    Period  Weeks    Status  On-going    Target Date  11/01/18            Plan - 10/14/18 1311    Clinical Impression Statement  The patient remains motivated to participate in strengthening program, but limited due to significant pain or increased numbness. Extra time needed for transfers on and off machines and mat, supervision-CGA to ensure safety. Often displays abberant movements and needs extended time prior to adjusting position, especially during sit to stand. Overall the patient would benefit from further skilled PT intervention to progress program as able to work towards return to [facility] and maximize QOL.    Personal Factors and Comorbidities  Time since onset of injury/illness/exacerbation;Past/Current Experience;Comorbidity 3+    Comorbidities  HTN, hx of hernia repair, migraines, degenerative disc disease in cervical and lumbar spine, hyperlipidemia, at least 4 lumbar surgeries (last in September 2019 that included L3-4 posterior and interbody fusion and left partial facetectomy and left foraminotomy. Previous surgeries included interbody fusion at L4-5 and L5-S1), past cervical spine surgery (ACDF C6-7), multiple spinal injections, hx hernia repair x 2, hx of bilateral septoplasty and nasal turbinate reduction, hx of R knee surgeries for torn meniscus.    Examination-Activity Limitations  Bathing;Hygiene/Grooming;Squat;Stairs;Lift;Bed Mobility;Bend;Stand;Toileting;Carry;Transfers;Sit;Dressing    Examination-Participation Restrictions  Cleaning;Laundry;Shop;Volunteer;Community Activity;Yard Work;Driving;Interpersonal Relationship    Rehab Potential  Fair    PT Frequency   2x / week    PT Treatment/Interventions  ADLs/Self Care Home Management;Aquatic Therapy;Cryotherapy;Electrical Stimulation;Moist Heat;Therapeutic activities;Therapeutic exercise;Neuromuscular re-education;Patient/family education;Manual techniques;Passive range of motion;Dry needling;Joint Manipulations;Spinal Manipulations;Other (comment)    PT Next Visit Plan  graded funcitonal exercise as tolerated, pain control; add cervical spine to treatment as able    PT Home Exercise Plan  Sciatic Nerve Flossing       Patient will benefit from skilled therapeutic intervention in order to improve the following deficits and impairments:  Decreased endurance, Decreased mobility, Difficulty walking, Hypomobility, Increased muscle spasms, Impaired sensation, Decreased range of motion, Decreased scar mobility, Impaired perceived functional ability, Improper body mechanics, Decreased activity tolerance, Decreased strength, Impaired flexibility, Impaired UE functional use, Pain  Visit Diagnosis: Chronic bilateral low back pain with bilateral sciatica  Paresthesia of skin  Muscle weakness (generalized)  Difficulty in walking, not elsewhere classified  Cervicalgia     Problem List Patient Active Problem List   Diagnosis Date Noted  . 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 Ruptured lumbar disc 10/09/2017  . Rhinitis medicamentosa 07/06/2015  . DDD (degenerative disc disease), lumbar 07/20/2014  . Hyperlipidemia 07/20/2014  . Hypertension 07/20/2014  . DDD (degenerative disc disease), cervical 07/20/2014  . Umbilical hernia 59/93/5701    Lieutenant Diego PT, DPT 1:19 PM,10/14/18 [phone]  [facility] [address] [place], [address] Phone: [phone]   Fax:  [phone]  Name: [name] MRN: [id number] Date of Birth: 01/14/1971

## 2018-10-18 ENCOUNTER — Ambulatory Visit: Payer: BLUE CROSS/BLUE SHIELD | Admitting: Physical Therapy

## 2018-10-18 ENCOUNTER — Other Ambulatory Visit: Payer: Self-pay

## 2018-10-18 DIAGNOSIS — M6281 Muscle weakness (generalized): Secondary | ICD-10-CM

## 2018-10-18 DIAGNOSIS — R202 Paresthesia of skin: Secondary | ICD-10-CM

## 2018-10-18 DIAGNOSIS — R262 Difficulty in walking, not elsewhere classified: Secondary | ICD-10-CM

## 2018-10-18 DIAGNOSIS — M5442 Lumbago with sciatica, left side: Secondary | ICD-10-CM | POA: Diagnosis not present

## 2018-10-18 DIAGNOSIS — M542 Cervicalgia: Secondary | ICD-10-CM

## 2018-10-18 DIAGNOSIS — G8929 Other chronic pain: Secondary | ICD-10-CM

## 2018-10-18 DIAGNOSIS — M5441 Lumbago with sciatica, right side: Secondary | ICD-10-CM

## 2018-10-18 NOTE — Therapy (Signed)
[facility] [address], [place], [address] Phone: [phone]   Fax:  [phone]  Physical Therapy Treatment / Discharge Summary Reporting period: 08/04/2018 - 10/18/2018  Patient Details  Name: [name] MRN: [id number] Date of Birth: 09-27-1970 Referring Provider (PT): Haywood Lasso   Encounter Date: 10/18/2018    Past Medical History:  Diagnosis Date  . DDD (degenerative disc disease), lumbar   . Dental bridge present    permanant - top left  . Headache    migraines - pinched nerve in neck - no mvmt limitations  . Hernia July 6834   umbilical  . Hypertension     Past Surgical History:  Procedure Laterality Date  . BACK SURGERY  2003, 2007, 2009, 2012   Dr Glenna Fellows  . HERNIA REPAIR  1962   umbilical hernia  . KNEE SURGERY Right 1997, 2001   torn miniscus  . LUMBAR DISC SURGERY     X 3 with hardware  . NASAL TURBINATE REDUCTION Bilateral 10/26/2015   Procedure: TURBINATE REDUCTION/SUBMUCOSAL RESECTION;  Surgeon: Beverly Gust, MD;  Location: [facility];  Service: ENT;  Laterality: Bilateral;  . SEPTOPLASTY Bilateral 10/26/2015   Procedure: SEPTOPLASTY;  Surgeon: Beverly Gust, MD;  Location: [facility];  Service: ENT;  Laterality: Bilateral;    There were no vitals filed for this visit.  Subjective Assessment - 10/18/18 1038    Subjective  Patient reports his pain has continued to stay about the same. He states that since he was here last he has been in the bed about 2 hours each day due to pain intolerance. He states he feels he is benefitting from physical therapy more than "staying home in bed." He reports his pain is 9/10. He spoke to the neurosurgeon last monday who said there was nothing he could do for him except carpal tunnel fix, which was frustrating to patient.    Pertinent History  See body of initial eval note for detailed history.    Limitations   Sitting;Lifting;Standing;Walking;House hold activities;Other (comment)    How long can you sit comfortably?  Sitting time: 30-60 min in comfortable chair, uncomfortable.    How long can you stand comfortably?  Standing time: 30 min uncomfortable    How long can you walk comfortably?  Walking: about 5 min or so (but variable). 1/4 to 1/5 mile in constant pain.    Diagnostic tests  Imaging: MR Lumbar spine report 11/14/2018: "IMPRESSION: 1. Surgical changes from recent surgery with L3-4 posterior and interbody fusion and left partial facetectomy and left foraminotomy. No obvious complicating features such as hematoma. 2. Remote posterior and interbody fusion changes at L4-5 and L5-S1. 3. Normal appearance of L1-2 and L2-3." MR C-spine report 10/30/2016: "IMPRESSION: Mild spinal stenosis and mild foraminal stenosis bilaterally C3-4 due to spurring unchanged. Progression of left foraminal disc protrusion at C5-6 with associated spurring. Expected compression left C6 nerve root. ACDF C6-7."    Patient Stated Goals  to return to PLOF and decrease pain; to be approved for disability funding until he is able to return to work. Legs buckle once or twice a day.    Currently in Pain?  Yes    Pain Score  9     Pain Location  Other (Comment)   all over, both hands are numb. Feet are "there" (numb).   Pain Descriptors / Indicators  --   legs feel like they are going to fall out  from under me   Pain Type  Chronic pain    Pain Radiating Towards  B UE and B LE numbness.    Pain Onset  More than a month ago    Pain Frequency  Constant    Aggravating Factors   laying down flat (on back) on reclining beds until foot is raised up he is in constant pain (pain in low back and running around left side to front, sharp pain), legs go numb in sitting an standing    Pain Relieving Factors  legs better when laying on back with feet up, laying on stomach with pillow over belly (legs and back), ice, long hot shower.    Effect of  Pain on Daily Activities  difficulty with ADLs, IADLs, dressing, hygiene, sleeping, bed mobility, work activities including floor <> stand transfers, squatting, transfers, lifting, bending, twisting, sitting, standing, walking, running, jumping, crawling, stabilizing loads, awkward positions        [facility] PT Assessment - 10/18/18 0001      Assessment   Medical Diagnosis  failed back syndrome of lumbar spine    Referring Provider (PT)  Haywood Lasso    Onset Date/Surgical Date  11/03/18    Hand Dominance  Left    Prior Therapy  acute care physical therapy following surgery prior to current episode of care.       Precautions   Precautions  Other (comment)    Precaution Comments   has not attempted lifting much of anything for a year. He has progressed for about 10-15 pounds.     Required Braces or Orthoses  --   no     Restrictions   Weight Bearing Restrictions  No      Balance Screen   Has the patient fallen in the past 6 months  Yes    How many times?  2    Has the patient had a decrease in activity level because of a fear of falling?   Yes    Is the patient reluctant to leave their home because of a fear of falling?   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    see body of note for further details   Leisure  He states work was his main hobby. 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 States he is currently unable to work in any of these capacities at this point.        Cognition   Overall Cognitive Status  Within Functional Limits for tasks assessed      Observation/Other Assessments   Observations  see note from 10/18/2018  for latests objective data    Focus on Therapeutic Outcomes (FOTO)   FOTO = 28 (10/18/2018);        OBJECTIVE: OBSERVATION/INSPECTION: Patient presents withdecreased lumbar and cervical lordosis consistent with history of spinal fusions, no obvious asymmetrical atrophy throughout body.  Patient has moderately muscular build with some but without excessive adipose tissue.  NEUROLOGICAL  UE dermatomes: equal but altered to feel like a tingle at C3 and T1. Diminished on L compared to R C4 - C8, and R side altered with tingling sensation.   B LE dermatomes: diminished to light touch bilaterally L2-S2. Tingling to touch.   Slump: positive bilaterally  STRENGTH:  R/L Shoulder:   Flexion: 4/4 (pain between shoulder blades)  Abduction 4+/4+ (pain between shoulder blades  ER: 3/3 (pain from mid spine to R under axilla)  IR: 3/3 ("not bad")  Elbow:   Flexion: 3/3 (mild pain between shoulder blades).   Extension: 3/3 (pain between shoulder blades)  Hip:  Flexion: (more painful on R than left).   Knee   Extension: 3/3 (unable to fully extend bilaterally due to pain in back of calf and thigh).   Flexion: 4-/3 (doesn't feel bad bilaterally)  Ankle   DF: 4-/4- (no increased pain)  Eversion: 4+/4 (feels alright)  Great toe extension: 5/5 (no extra pain).   PALPATION:  Deferred this session due to time limitation but previously TTP with guarding throughout lumbar spine and cervical spine, with more intermittent TTP in thoracic spine.   FUNCTIONAL MOBILITY:  Transfers:sit <> stand from chair and chair height  plinth independent with very slow and aberent motion rising using hands to walk up legs.  Gait:WFL with SPC but painful for household and short community distances. Uses SPC due to reports of daily knee buckling.   FUNCTIONAL/BALANCE TESTS:  Squat (self selected): squats with back upright, slowly with maximal flexion at knees and ankles. Buttocks meets heels. Very slow painful movement, difficulty rising (must bounce in bottom position to get momentum to getup). No lumbar flexion, minimal forward incline of trunk from hips.   Did not attempt lift from floor due to difficulty with squat.    TREATMENT Therapeutic exercise:to centralize symptoms and improve ROM, strength, muscular endurance, and activity tolerance required for successful completion of functional activities. -NuStep level2using bilateral lower extremitiesonly.Moist heat applied to lumbar spine for increased comfort.Seat setting 10. For improved extremity mobility, muscular endurance, and activity tolerance; and to induce the analgesic effect of aerobic exercise, stimulate improved joint nutrition, and prepare body structures and systems for following interventions. x5Minutes during subjective exam.  - objective measurements to assess progress (see above). - completion of FOTO questionnaire (10 min unbilled) - education on next steps and staying active at home upon discharge   [place]: [email or web address]  URL: [email or web address]  Date: 09/29/2018  Prepared by: Rosita Kea   Exercises   Supine Lower Trunk Rotation - 40 reps - 2x daily - 7x weekly   Supine LE Neural Mobilization - 15 reps - 2x daily - 7x weekly  Assessment/Clinical Impression:  Patient has attended 20 physical therapy treatment sessions this episode of care. He initially showed some improvement but has not progressed significantly overall since last progress assessment. Patient continues to be severely  limited in his ability to tolerate activity in the clinic or at home due to pain. He gets short term relief from some manual interventions and has been able to engage in graded strengthening exercises but has not been able  to progress reliably. His FOTO score has worsened today by about 25%, he reports being able to tolerate sitting, walking, and standing about 50% or less of his report last progress note, and his MMT is slightly decreased in strength from last progress assessment. Subjectively, patient reports continued severe limitations in functional activity tolerance, going from needing to be in the bed 4 hours a day to staying in the bed all the time except for about 2 hours each day. Patient was provided resources to allow him to pursue treatment at another physical therapy clinic in [place] that is part of a center that specializes in chronic pain and has more specialized equipment and care for complex pain. Patient agreed that he is not making progress at this point. He has reported limited ability to participate in HEP but received discharge instructions about graded return to activity and continued participation in HEP as able.    PT Short Term Goals - 10/18/18 1151      PT SHORT TERM GOAL #1   Title  Be independent with initial home exercise program for self-management of symptoms.    Baseline  Initial HEP to be provided at visit 2 (08/04/2018); pt provided with initial HEP, delayed due to lack of tolerance to activity (08/16/2018); pt reports his participation is "hit or miss" right now (09/06/2018); not great and intermittant per pt (10/18/2018);    Time  2    Period  Weeks    Status  Partially Met    Target Date  08/18/18        PT Long Term Goals - 10/18/18 1152      PT LONG TERM GOAL #1   Title  Be independent with a long-term home exercise program for self-management of symptoms.    Baseline  Initial HEP to provided at visit 2 (08/04/2018); pt reports his participation is "hit or  miss" right now (09/06/2018); continues to have difficulty completing due to pain (10/18/2018);    Time  8    Period  Weeks    Status  Partially Met    Target Date  11/01/18      PT LONG TERM GOAL #2   Title  Demonstrate improved FOTO score by 10 units to demonstrate improvement in overall condition and self-reported functional ability.    Baseline  FOTO = 37 (08/04/2018); FOTO = 38 (09/06/2018); FOTO = 28 (10/18/2018);    Time  8    Period  Weeks    Status  Not Met    Target Date  11/01/18      PT LONG TERM GOAL #3   Title  Be able to squat to chair height with proper form without limitation due to current condition in order to improve ability to lift and complete transfers during usual and work activities.    Baseline  see objective exam (08/04/2018); improving but still very limited see objective exam (09/06/2018); continues to be very limited see objective exam (10/18/2018);    Time  8    Period  Weeks    Status  Partially Met    Target Date  11/01/18      PT LONG TERM GOAL #4   Title  Patient will demonstrate B UE and LE strength 4+/5 or higher to demonstrate functional strength for improved gait, increased standing tolerance, reaching, lifting, carrying and work activities.    Baseline  see objective exam (08/04/2018); improving but still limited, see objective exam (09/06/2018); no improvement since last assessment - see objective  exam (10/18/2018);    Time  8    Period  Weeks    Status  Not Met    Target Date  11/01/18      PT LONG TERM GOAL #5   Title  Be able to demonstrate floor to waist lift with proper form without limitation due to current condition in order to improve ability to lift during usual and work activities.    Baseline  see objective exam (08/04/2018); used similar technique to previously, squat is slightly deeper see objective exam (09/06/2018); deferred due to great difficulty wtih squat (10/18/2018);    Time  8    Period  Weeks    Status  Not Met    Target Date  11/01/18       PT LONG TERM GOAL #6   Title  Patient will be able to return to full time employment without limitaiton due to current condition.    Baseline  currently unable to tolerate work activities - see subjective and objective exam (08/04/2018); currently unable to tolerate work activities - see subjective and objective exam (09/06/2018, 10/18/2018)    Time  8    Period  Weeks    Status  Not Met    Target Date  11/01/18            Plan - 10/18/18 1405    Clinical Impression Statement  Patient has attended 20 physical therapy treatment sessions this episode of care. He initially showed some improvement but has not progressed significantly overall since last progress assessment. Patient continues to be severely limited in his ability to tolerate activity in the clinic or at home due to pain. He gets short term relief from some manual interventions and has been able to engage in graded strengthening exercises but has not been able to progress reliably. His FOTO score has worsened today by about 25%, he reports being able to tolerate sitting, walking, and standing about 50% or less of his report last progress note, and his MMT is slightly decreased in strength from last progress assessment. Subjectively, patient reports continued severe limitations in functional activity tolerance, going from needing to be in the bed 4 hours a day to staying in the bed all the time except for about 2 hours each day. Patient was provided resources to allow him to pursue treatment at another physical therapy clinic in [place] that is part of a center that specializes in chronic pain and has more specialized equipment and care for complex pain. Patient agreed that he is not making progress at this point. He has reported limited ability to participate in HEP but received discharge instructions about graded return to activity and continued participation in HEP as able.    Personal Factors and Comorbidities  Time since onset of  injury/illness/exacerbation;Past/Current Experience;Comorbidity 3+    Comorbidities  HTN, hx of hernia repair, migraines, degenerative disc disease in cervical and lumbar spine, hyperlipidemia, at least 4 lumbar surgeries (last in September 2019 that included L3-4 posterior and interbody fusion and left partial facetectomy and left foraminotomy. Previous surgeries included interbody fusion at L4-5 and L5-S1), past cervical spine surgery (ACDF C6-7), multiple spinal injections, hx hernia repair x 2, hx of bilateral septoplasty and nasal turbinate reduction, hx of R knee surgeries for torn meniscus.    Examination-Activity Limitations  Bathing;Hygiene/Grooming;Squat;Stairs;Lift;Bed Mobility;Bend;Stand;Toileting;Carry;Transfers;Sit;Dressing    Examination-Participation Restrictions  Cleaning;Laundry;Shop;Volunteer;Community Activity;Yard Work;Driving;Interpersonal Relationship    Rehab Potential  Fair    PT Frequency  2x / week  PT Treatment/Interventions  ADLs/Self Care Home Management;Aquatic Therapy;Cryotherapy;Electrical Stimulation;Moist Heat;Therapeutic activities;Therapeutic exercise;Neuromuscular re-education;Patient/family education;Manual techniques;Passive range of motion;Dry needling;Joint Manipulations;Spinal Manipulations;Other (comment)    PT Next Visit Plan  Patient is now discharged from physical therapy at this clinic due to lack of progress.    PT Home Exercise Plan  Sciatic Nerve Flossing    Consulted and Agree with Plan of Care  Patient       Patient will benefit from skilled therapeutic intervention in order to improve the following deficits and impairments:  Decreased endurance, Decreased mobility, Difficulty walking, Hypomobility, Increased muscle spasms, Impaired sensation, Decreased range of motion, Decreased scar mobility, Impaired perceived functional ability, Improper body mechanics, Decreased activity tolerance, Decreased strength, Impaired flexibility, Impaired UE  functional use, Pain  Visit Diagnosis: Chronic bilateral low back pain with bilateral sciatica  Paresthesia of skin  Muscle weakness (generalized)  Difficulty in walking, not elsewhere classified  Cervicalgia     Problem List Patient Active Problem List   Diagnosis Date Noted  . 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 Ruptured lumbar disc 10/09/2017  . Rhinitis medicamentosa 07/06/2015  . DDD (degenerative disc disease), lumbar 07/20/2014  . Hyperlipidemia 07/20/2014  . Hypertension 07/20/2014  . DDD (degenerative disc disease), cervical 07/20/2014  . Umbilical hernia 53/00/5110    Everlean Alstrom. Graylon Good, PT, DPT 10/18/18, 2:07 PM  [facility] [address] [place], [address] Phone: [phone]   Fax:  [phone]  Name: [name] MRN: [id number] Date of Birth: 04/07/1970

## 2018-10-20 ENCOUNTER — Ambulatory Visit: Payer: BLUE CROSS/BLUE SHIELD | Admitting: Physical Therapy

## 2018-10-25 ENCOUNTER — Encounter: Payer: BLUE CROSS/BLUE SHIELD | Admitting: Physical Therapy

## 2018-10-27 ENCOUNTER — Encounter: Payer: BLUE CROSS/BLUE SHIELD | Admitting: Physical Therapy

## 2018-11-01 ENCOUNTER — Encounter: Payer: BLUE CROSS/BLUE SHIELD | Admitting: Physical Therapy

## 2018-11-03 ENCOUNTER — Encounter: Payer: BLUE CROSS/BLUE SHIELD | Admitting: Physical Therapy

## 2018-11-16 ENCOUNTER — Other Ambulatory Visit: Payer: Self-pay

## 2018-11-16 DIAGNOSIS — Z20822 Contact with and (suspected) exposure to covid-19: Secondary | ICD-10-CM

## 2018-11-18 ENCOUNTER — Encounter: Payer: Self-pay | Admitting: Family Medicine

## 2018-11-18 LAB — NOVEL CORONAVIRUS, NAA: SARS-CoV-2, NAA: NOT DETECTED

## 2018-11-22 ENCOUNTER — Other Ambulatory Visit: Payer: Self-pay

## 2018-11-23 ENCOUNTER — Other Ambulatory Visit: Payer: Self-pay

## 2018-11-23 DIAGNOSIS — Z20822 Contact with and (suspected) exposure to covid-19: Secondary | ICD-10-CM

## 2018-11-24 ENCOUNTER — Telehealth: Payer: Self-pay | Admitting: Family Medicine

## 2018-11-24 LAB — NOVEL CORONAVIRUS, NAA: SARS-CoV-2, NAA: NOT DETECTED

## 2018-11-24 NOTE — Telephone Encounter (Signed)
Negative COVID results given. Patient results "NOT Detected." Caller expressed understanding. ° °

## 2019-04-04 DIAGNOSIS — N200 Calculus of kidney: Secondary | ICD-10-CM

## 2019-04-04 HISTORY — DX: Calculus of kidney: N20.0

## 2019-05-02 MED ORDER — DULOXETINE HCL 30 MG PO CPEP
30.00 | ORAL_CAPSULE | ORAL | Status: DC
Start: 2019-05-03 — End: 2019-05-02

## 2019-05-02 MED ORDER — CALCIUM CARBONATE 1250 (500 CA) MG PO CHEW
CHEWABLE_TABLET | ORAL | Status: DC
Start: ? — End: 2019-05-02

## 2019-05-02 MED ORDER — INFLUENZA VAC SPLIT QUAD 0.5 ML IM SUSY
0.50 | PREFILLED_SYRINGE | INTRAMUSCULAR | Status: DC
Start: ? — End: 2019-05-02

## 2019-05-02 MED ORDER — ACETAMINOPHEN 325 MG PO TABS
650.00 | ORAL_TABLET | ORAL | Status: DC
Start: 2019-05-02 — End: 2019-05-02

## 2019-05-02 MED ORDER — GENERIC EXTERNAL MEDICATION
1.00 | Status: DC
Start: 2019-05-03 — End: 2019-05-02

## 2019-05-02 MED ORDER — CYCLOBENZAPRINE HCL 10 MG PO TABS
10.00 | ORAL_TABLET | ORAL | Status: DC
Start: 2019-05-02 — End: 2019-05-02

## 2019-05-02 MED ORDER — ONDANSETRON HCL 4 MG/2ML IJ SOLN
4.00 | INTRAMUSCULAR | Status: DC
Start: ? — End: 2019-05-02

## 2019-05-02 MED ORDER — DEXTROSE-SODIUM CHLORIDE 5-0.9 % IV SOLN
100.00 | INTRAVENOUS | Status: DC
Start: ? — End: 2019-05-02

## 2019-05-02 MED ORDER — GENERIC EXTERNAL MEDICATION
Status: DC
Start: ? — End: 2019-05-02

## 2019-05-08 IMAGING — CR DG CERVICAL SPINE 2 OR 3 VIEWS
1 series · 4 of 4 positions shown · non-contrast
Comparison: None.

CLINICAL DATA: Neck pain and hand tingling on the left, initial
encounter

EXAM:
CERVICAL SPINE - 2-3 VIEW

[Series 1: dg cervical spine 2 or 3 views · 0.14mm/px · 4 of 4 slices shown]
[im 1/4]
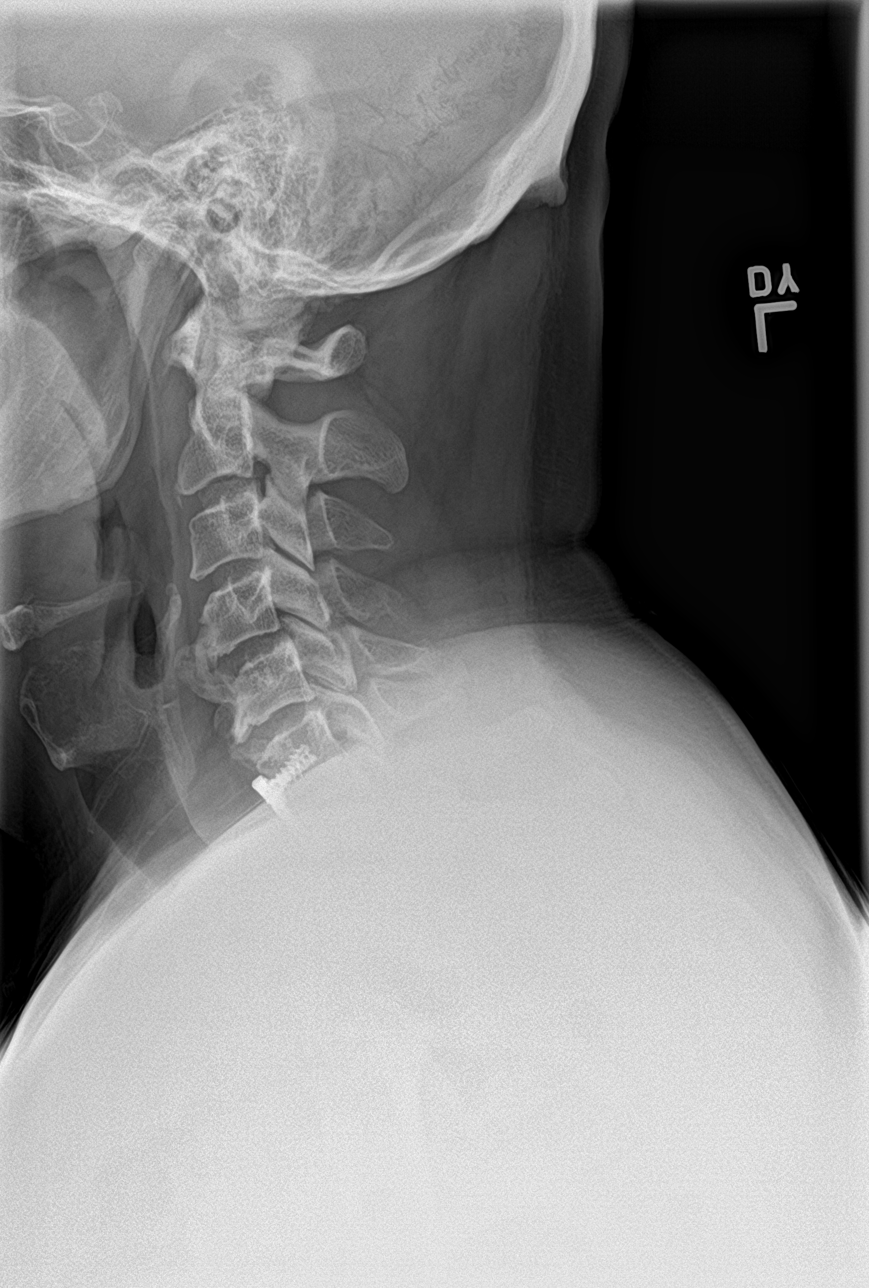
[im 2/4]
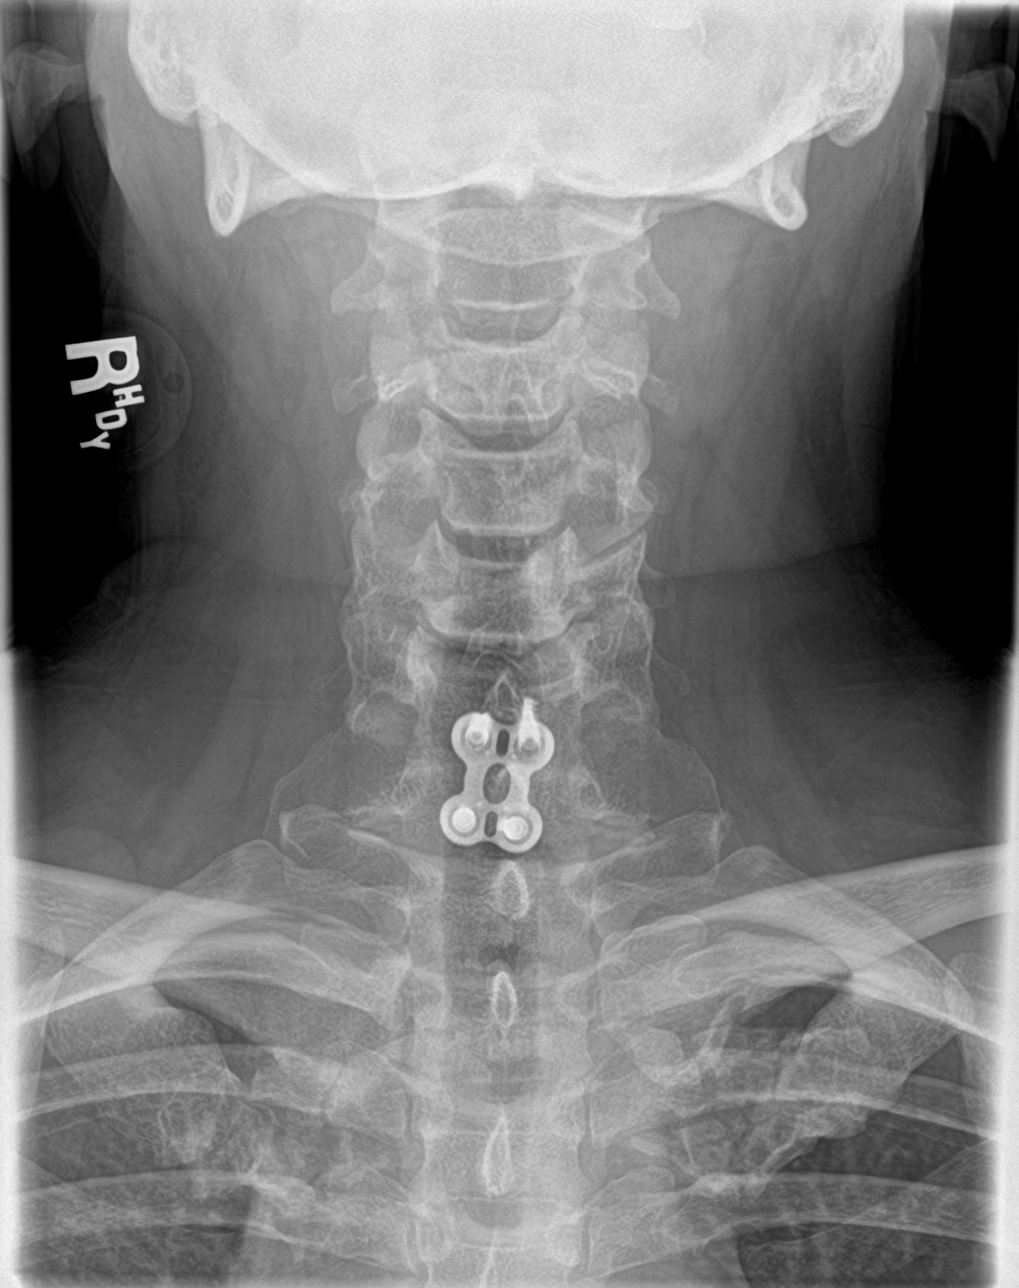
[im 3/4]
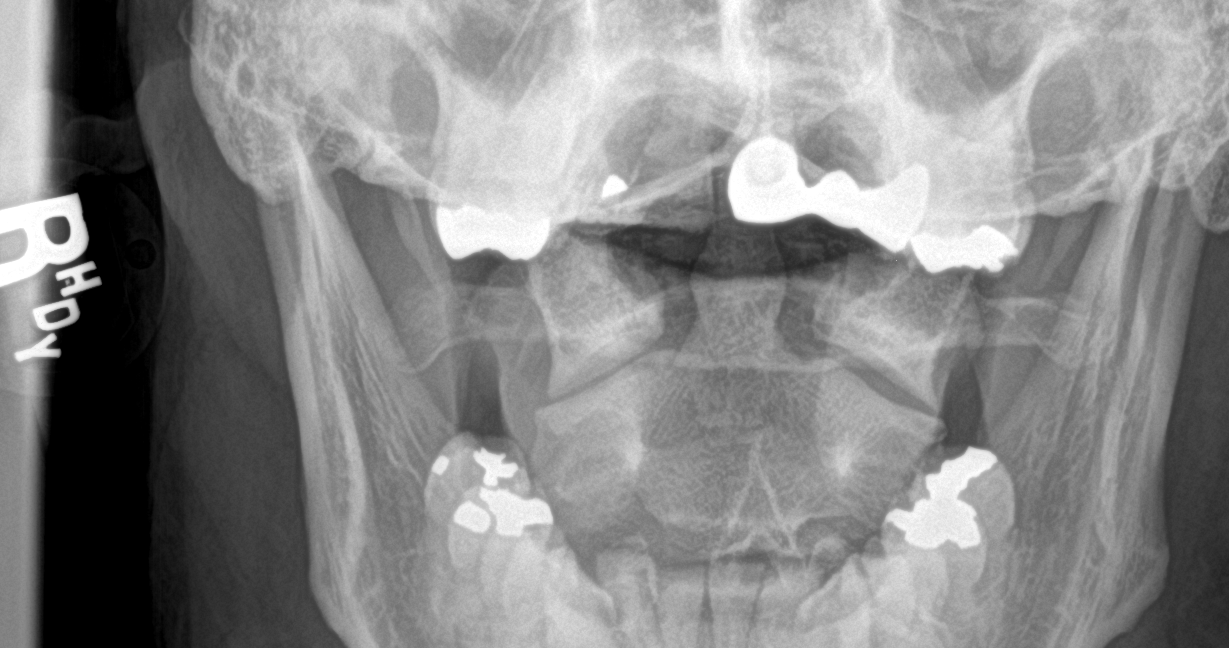
[im 4/4]
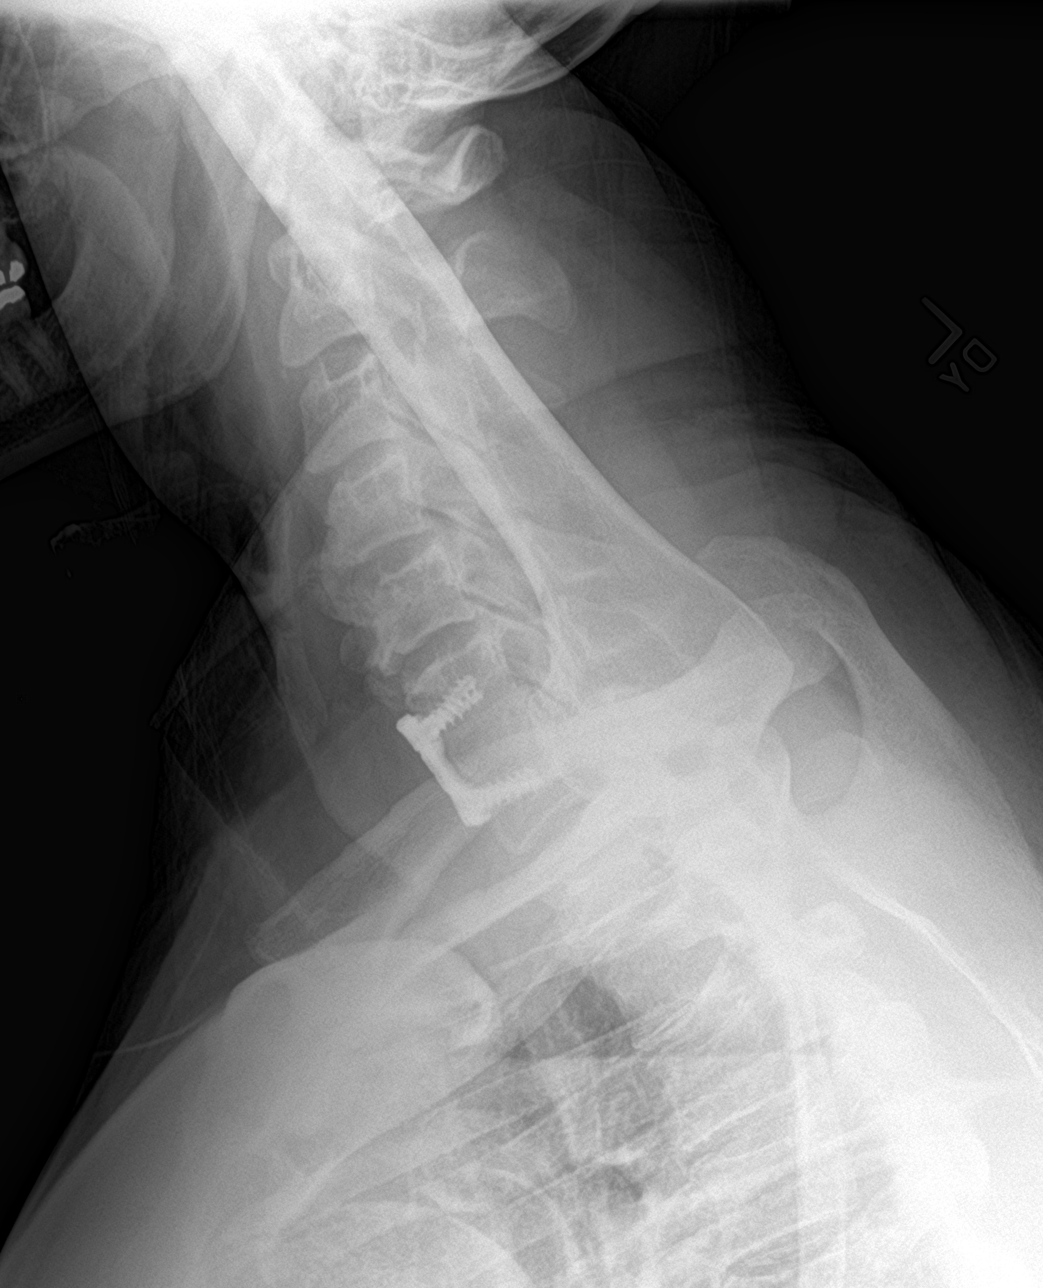

[4 of 4 positions shown; findings below may reference images not displayed]

FINDINGS: Seven cervical segments are well visualized. Changes of prior fusion
at C6-7 are noted. Osteophytic changes at C4-5 and C5-6 are seen.
Mild facet hypertrophic changes are noted. The odontoid is within
normal limits.
IMPRESSION: Postsurgical and degenerative changes as described. No acute
abnormality noted.

## 2019-05-19 ENCOUNTER — Other Ambulatory Visit: Payer: Self-pay

## 2019-05-19 ENCOUNTER — Encounter: Payer: Self-pay | Admitting: Nurse Practitioner

## 2019-05-19 ENCOUNTER — Ambulatory Visit (INDEPENDENT_AMBULATORY_CARE_PROVIDER_SITE_OTHER): Payer: BLUE CROSS/BLUE SHIELD | Admitting: Nurse Practitioner

## 2019-05-19 VITALS — BP 137/91 | HR 90 | Temp 98.5°F | Wt 226.5 lb

## 2019-05-19 DIAGNOSIS — L02415 Cutaneous abscess of right lower limb: Secondary | ICD-10-CM | POA: Diagnosis not present

## 2019-05-19 MED ORDER — SULFAMETHOXAZOLE-TRIMETHOPRIM 400-80 MG PO TABS: 2.0000 | ORAL_TABLET | Freq: Two times a day (BID) | ORAL | 0 refills | Status: AC

## 2019-05-19 NOTE — Progress Notes (Signed)
BP (!) 137/91 (BP Location: Right Arm, Patient Position: Sitting, Cuff Size: Normal)   Pulse 90   Temp 98.5 F (36.9 C) (Oral)   Wt 226 lb 8 oz (102.7 kg)   SpO2 97%   BMI 32.50 kg/m    Subjective:    Patient ID: [name], male    DOB: 1970/11/12, 49 y.o.   MRN: [id number]  HPI: [name] is a 49 y.o. male presenting with a boil to his right upper thigh.  Chief Complaint  Patient presents with  . Recurrent Skin Infections    Right side upper thigh near groin area   SKIN LESION Reports a blood blister to his right upper thigh.  He reports he sterilized a needle and popped it multiple times and got blood out but it will not go away. Duration: weeks - 3  Location: right upper thigh Painful: no Itching: no  Rash: no Irritated: yes; pinched Onset: sudden Context: not changing  Fever: no SOB chest pain: no History of skin cancer: no History of precancerous skin lesions: no Family history of skin cancer: no  Allergies  Allergen Reactions  . Other Rash    Neoprene  . Amlodipine     Reflux.  . Celebrex [Celecoxib]     GI issues  . Latex Rash    Only when wearing Powdered gloves   Outpatient Encounter Medications as of 05/19/2019  Medication Sig Note  . cyclobenzaprine (FLEXERIL) 10 MG tablet Take 10 mg by mouth 3 (three) times daily as needed for muscle spasms. 12/21/2015: Pt states he has this med, but has not taken in a few months  . OXYCODONE HCL PO Take by mouth. PRN   . triamcinolone (NASACORT) 55 MCG/ACT AERO nasal inhaler Place 2 sprays into the nose daily.   Marland Kitchen gabapentin (NEURONTIN) 300 MG capsule Take 1 capsule (300 mg total) by mouth 3 (three) times daily.   Marland Kitchen HYDROcodone-acetaminophen (NORCO/VICODIN) 5-325 MG tablet Take 1 tablet by mouth every 8 (eight) hours as needed for moderate pain. 12/21/2015: Pt states he has this med, but has not taken in a while  . sulfamethoxazole-trimethoprim (BACTRIM) 400-80 MG tablet Take 2 tablets by mouth  2 (two) times daily.    No facility-administered encounter medications on file as of 05/19/2019.   Patient Active Problem List   Diagnosis Date Noted  . Cutaneous abscess of right lower extremity 05/19/2019  . 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 Ruptured lumbar disc 10/09/2017  . Rhinitis medicamentosa 07/06/2015  . DDD (degenerative disc disease), lumbar 07/20/2014  . Hyperlipidemia 07/20/2014  . Hypertension 07/20/2014  . DDD (degenerative disc disease), cervical 07/20/2014  . Umbilical hernia 09/16/4816   Past Medical History:  Diagnosis Date  . DDD (degenerative disc disease), lumbar   . Dental bridge present    permanant - top left  . Headache    migraines - pinched nerve in neck - no mvmt limitations  . Hernia July 5631   umbilical  . Hypertension   . Kidney stones 04/2019   Relevant past medical, surgical, family and social history reviewed and updated as indicated. Interim medical history since our last visit reviewed.  Review of Systems  Constitutional: Negative.  Negative for fatigue and fever.  Respiratory: Negative.  Negative for shortness of breath.   Cardiovascular: Negative.  Negative for chest pain.  Skin: Positive for wound.  Neurological: Negative.  Negative for dizziness.  Psychiatric/Behavioral: Negative.    Per HPI unless specifically indicated above  Objective:    BP (!) 137/91 (BP Location: Right Arm, Patient Position: Sitting, Cuff Size: Normal)   Pulse 90   Temp 98.5 F (36.9 C) (Oral)   Wt 226 lb 8 oz (102.7 kg)   SpO2 97%   BMI 32.50 kg/m   Wt Readings from Last 3 Encounters:  05/19/19 226 lb 8 oz (102.7 kg)  12/22/17 230 lb (104.3 kg)  11/13/17 222 lb (100.7 kg)    Physical Exam Vitals and nursing note reviewed.  Constitutional:      General: He is not in acute distress.    Appearance: Normal appearance. He is not toxic-appearing.  Cardiovascular:     Rate and Rhythm: Normal rate.  Pulmonary:     Effort: Pulmonary  effort is normal. No respiratory distress.  Skin:    General: Skin is dry.     Findings: Abscess present.       Neurological:     General: No focal deficit present.     Mental Status: He is alert and oriented to person, place, and time.     Motor: No weakness.     Gait: Gait normal.  Psychiatric:        Mood and Affect: Mood normal.        Behavior: Behavior normal.        Thought Content: Thought content normal.        Judgment: Judgment normal.       Assessment & Plan:   Problem List Items Addressed This Visit      Musculoskeletal and Integument   Cutaneous abscess of right lower extremity - Primary    Acute, ongoing.  Do not think I&D will relieve abscess as it is not fluctuant.  Will start on 7-day course of Bactrim.  Continue good wound care, may place vaseline or neosporin over site.   Can use warm compresses and/or ice to area to alleviate discomfrot.  Advised if not better by Monday, call or return to clinic.      Relevant Medications   sulfamethoxazole-trimethoprim (BACTRIM) 400-80 MG tablet       Follow up plan: Return if symptoms worsen or fail to improve.

## 2019-05-19 NOTE — Patient Instructions (Signed)
Skin Abscess  A skin abscess is an infected area on or under your skin that contains a collection of pus and other material. An abscess may also be called a furuncle, carbuncle, or boil. An abscess can occur in or on almost any part of your body. Some abscesses break open (rupture) on their own. Most continue to get worse unless they are treated. The infection can spread deeper into the body and eventually into your blood, which can make you feel ill. Treatment usually involves draining the abscess. What are the causes? An abscess occurs when germs, like bacteria, pass through your skin and cause an infection. This may be caused by:  A scrape or cut on your skin.  A puncture wound through your skin, including a needle injection or insect bite.  Blocked oil or sweat glands.  Blocked and infected hair follicles.  A cyst that forms beneath your skin (sebaceous cyst) and becomes infected. What increases the risk? This condition is more likely to develop in people who:  Have a weak body defense system (immune system).  Have diabetes.  Have dry and irritated skin.  Get frequent injections or use illegal IV drugs.  Have a foreign body in a wound, such as a splinter.  Have problems with their lymph system or veins. What are the signs or symptoms? Symptoms of this condition include:  A painful, firm bump under the skin.  A bump with pus at the top. This may break through the skin and drain. Other symptoms include:  Redness surrounding the abscess site.  Warmth.  Swelling of the lymph nodes (glands) near the abscess.  Tenderness.  A sore on the skin. How is this diagnosed? This condition may be diagnosed based on:  A physical exam.  Your medical history.  A sample of pus. This may be used to find out what is causing the infection.  Blood tests.  Imaging tests, such as an ultrasound, CT scan, or MRI. How is this treated? A small abscess that drains on its own may  not need treatment. Treatment for larger abscesses may include:  Moist heat or heat pack applied to the area several times a day.  A procedure to drain the abscess (incision and drainage).  Antibiotic medicines. For a severe abscess, you may first get antibiotics through an IV and then change to antibiotics by mouth. Follow these instructions at home: Medicines   Take over-the-counter and prescription medicines only as told by your health care provider.  If you were prescribed an antibiotic medicine, take it as told by your health care provider. Do not stop taking the antibiotic even if you start to feel better. Abscess care   If you have an abscess that has not drained, apply heat to the affected area. Use the heat source that your health care provider recommends, such as a moist heat pack or a heating pad. ? Place a towel between your skin and the heat source. ? Leave the heat on for 20-30 minutes. ? Remove the heat if your skin turns bright red. This is especially important if you are unable to feel pain, heat, or cold. You may have a greater risk of getting burned.  Follow instructions from your health care provider about how to take care of your abscess. Make sure you: ? Cover the abscess with a bandage (dressing). ? Change your dressing or gauze as told by your health care provider. ? Wash your hands with soap and water before you change the   dressing or gauze. If soap and water are not available, use hand sanitizer.  Check your abscess every day for signs of a worsening infection. Check for: ? More redness, swelling, or pain. ? More fluid or blood. ? Warmth. ? More pus or a bad smell. General instructions  To avoid spreading the infection: ? Do not share personal care items, towels, or hot tubs with others. ? Avoid making skin contact with other people.  Keep all follow-up visits as told by your health care provider. This is important. Contact a health care provider if  you have:  More redness, swelling, or pain around your abscess.  More fluid or blood coming from your abscess.  Warm skin around your abscess.  More pus or a bad smell coming from your abscess.  A fever.  Muscle aches.  Chills or a general ill feeling. Get help right away if you:  Have severe pain.  See red streaks on your skin spreading away from the abscess. Summary  A skin abscess is an infected area on or under your skin that contains a collection of pus and other material.  A small abscess that drains on its own may not need treatment.  Treatment for larger abscesses may include having a procedure to drain the abscess and taking an antibiotic. This information is not intended to replace advice given to you by your health care provider. Make sure you discuss any questions you have with your health care provider. Document Revised: 05/13/2018 Document Reviewed: 03/05/2017 Elsevier Patient Education  2020 [employer]  

## 2019-05-19 NOTE — Assessment & Plan Note (Signed)
Acute, ongoing.  Do not think I&D will relieve abscess as it is not fluctuant.  Will start on 7-day course of Bactrim.  Continue good wound care, may place vaseline or neosporin over site.   Can use warm compresses and/or ice to area to alleviate discomfrot.  Advised if not better by Monday, call or return to clinic.

## 2019-10-01 ENCOUNTER — Emergency Department
Admission: EM | Admit: 2019-10-01 | Discharge: 2019-10-01 | Disposition: A | Discharge status: Home / Self Care | Payer: BLUE CROSS/BLUE SHIELD | Attending: Emergency Medicine | Admitting: Emergency Medicine

## 2019-10-01 ENCOUNTER — Other Ambulatory Visit: Payer: Self-pay

## 2019-10-01 ENCOUNTER — Emergency Department: Payer: BLUE CROSS/BLUE SHIELD

## 2019-10-01 DIAGNOSIS — I10 Essential (primary) hypertension: Secondary | ICD-10-CM | POA: Diagnosis not present

## 2019-10-01 DIAGNOSIS — R079 Chest pain, unspecified: Secondary | ICD-10-CM | POA: Diagnosis not present

## 2019-10-01 DIAGNOSIS — Z79899 Other long term (current) drug therapy: Secondary | ICD-10-CM | POA: Diagnosis not present

## 2019-10-01 DIAGNOSIS — I480 Paroxysmal atrial fibrillation: Secondary | ICD-10-CM

## 2019-10-01 DIAGNOSIS — R55 Syncope and collapse: Secondary | ICD-10-CM | POA: Diagnosis not present

## 2019-10-01 DIAGNOSIS — Z91040 Latex allergy status: Secondary | ICD-10-CM | POA: Diagnosis not present

## 2019-10-01 DIAGNOSIS — M549 Dorsalgia, unspecified: Secondary | ICD-10-CM | POA: Insufficient documentation

## 2019-10-01 DIAGNOSIS — Z87442 Personal history of urinary calculi: Secondary | ICD-10-CM | POA: Insufficient documentation

## 2019-10-01 DIAGNOSIS — R1013 Epigastric pain: Secondary | ICD-10-CM | POA: Insufficient documentation

## 2019-10-01 LAB — CBC WITH DIFFERENTIAL/PLATELET
Abs Immature Granulocytes: 0.05 10*3/uL (ref 0.00–0.07)
Basophils Absolute: 0 10*3/uL (ref 0.0–0.1)
Basophils Relative: 0 %
Eosinophils Absolute: 0.1 10*3/uL (ref 0.0–0.5)
Eosinophils Relative: 1 %
HCT: 45.1 % (ref 39.0–52.0)
Hemoglobin: 15.2 g/dL (ref 13.0–17.0)
Immature Granulocytes: 1 %
Lymphocytes Relative: 16 %
Lymphs Abs: 1.6 10*3/uL (ref 0.7–4.0)
MCH: 31.9 pg (ref 26.0–34.0)
MCHC: 33.7 g/dL (ref 30.0–36.0)
MCV: 94.7 fL (ref 80.0–100.0)
Monocytes Absolute: 0.7 10*3/uL (ref 0.1–1.0)
Monocytes Relative: 7 %
Neutro Abs: 7.5 10*3/uL (ref 1.7–7.7)
Neutrophils Relative %: 75 %
Platelets: 238 10*3/uL (ref 150–400)
RBC: 4.76 MIL/uL (ref 4.22–5.81)
RDW: 12 % (ref 11.5–15.5)
WBC: 9.9 10*3/uL (ref 4.0–10.5)
nRBC: 0 % (ref 0.0–0.2)

## 2019-10-01 LAB — LIPASE, BLOOD: Lipase: 40 U/L (ref 11–51)

## 2019-10-01 LAB — PROTIME-INR
INR: 0.9 (ref 0.8–1.2)
Prothrombin Time: 11.5 seconds (ref 11.4–15.2)

## 2019-10-01 LAB — COMPREHENSIVE METABOLIC PANEL
ALT: 39 U/L (ref 0–44)
AST: 31 U/L (ref 15–41)
Albumin: 4.2 g/dL (ref 3.5–5.0)
Alkaline Phosphatase: 77 U/L (ref 38–126)
Anion gap: 9 (ref 5–15)
BUN: 16 mg/dL (ref 6–20)
CO2: 28 mmol/L (ref 22–32)
Calcium: 9.1 mg/dL (ref 8.9–10.3)
Chloride: 102 mmol/L (ref 98–111)
Creatinine, Ser: 1.04 mg/dL (ref 0.61–1.24)
GFR calc Af Amer: >60 mL/min (ref >60)
GFR calc non Af Amer: >60 mL/min (ref >60)
Glucose, Bld: 142 mg/dL — ABNORMAL HIGH (ref 70–99)
Potassium: 4 mmol/L (ref 3.5–5.1)
Sodium: 139 mmol/L (ref 135–145)
Total Bilirubin: 0.7 mg/dL (ref 0.3–1.2)
Total Protein: 7.3 g/dL (ref 6.5–8.1)

## 2019-10-01 LAB — APTT: aPTT: 26 seconds (ref 24–36)

## 2019-10-01 LAB — TROPONIN I (HIGH SENSITIVITY)
Troponin I (High Sensitivity): 4 ng/L (ref <18)
Troponin I (High Sensitivity): 3 ng/L (ref <18)

## 2019-10-01 MED ORDER — IOHEXOL 350 MG/ML SOLN
125.0000 mL | Freq: Once | INTRAVENOUS | Status: AC | PRN
  Administered 2019-10-01: 125 mL via INTRAVENOUS

## 2019-10-01 MED ORDER — ONDANSETRON HCL 4 MG/2ML IJ SOLN
4.0000 mg | Freq: Once | INTRAMUSCULAR | Status: AC
  Administered 2019-10-01: 4 mg via INTRAVENOUS
  Filled 2019-10-01: qty 2

## 2019-10-01 MED ORDER — FAMOTIDINE IN NACL 20-0.9 MG/50ML-% IV SOLN
20.0000 mg | Freq: Once | INTRAVENOUS | Status: AC
  Administered 2019-10-01: 20 mg via INTRAVENOUS
  Filled 2019-10-01: qty 50

## 2019-10-01 MED ORDER — MAGNESIUM SULFATE 2 GM/50ML IV SOLN
2.0000 g | Freq: Once | INTRAVENOUS | Status: AC
  Administered 2019-10-01: 2 g via INTRAVENOUS
  Filled 2019-10-01: qty 50

## 2019-10-01 MED ORDER — MORPHINE SULFATE (PF) 4 MG/ML IV SOLN
4.0000 mg | Freq: Once | INTRAVENOUS | Status: AC
  Administered 2019-10-01: 4 mg via INTRAVENOUS
  Filled 2019-10-01: qty 1

## 2019-10-01 MED ORDER — ALUM & MAG HYDROXIDE-SIMETH 200-200-20 MG/5ML PO SUSP
30.0000 mL | Freq: Once | ORAL | Status: AC
  Administered 2019-10-01: 30 mL via ORAL
  Filled 2019-10-01: qty 30

## 2019-10-01 NOTE — ED Notes (Signed)
Patient verbalizes understanding of discharge instructions. Opportunity for questioning and answers were provided. Armband removed by staff, pt discharged from ED. Wheeled out to lobby  

## 2019-10-01 NOTE — ED Notes (Signed)
Pt transported to CT ?

## 2019-10-01 NOTE — Discharge Instructions (Addendum)

## 2019-10-01 NOTE — ED Notes (Signed)
Pt resting comfortably in stretcher, wife bedside. CP, and abd pain decreased. No other needs at this time.

## 2019-10-01 NOTE — ED Provider Notes (Addendum)
[facility] Emergency Department Provider Note  ____________________________________________  Time seen: Approximately 1:35 AM  I have reviewed the triage vital signs and the nursing notes.   HISTORY  Chief Complaint Chest Pain   HPI [name] is a 49 y.o. male with history of hypertension, kidney stones, umbilical hernia, and degenerative disc disease who presents for evaluation of syncope.  Patient reports that he started having epigastric abdominal pain that he describes as indigestion and pressure a couple of hours ago.  The pain migrated up to his chest.  He was in the bathroom trying to have a bowel movement to see if it would relieve the pain when he started feeling dizzy.  He called for his wife.  While sitting on the floor of the bathroom patient had an episode of loss of consciousness and was unresponsive for about a minute.  At this time he is complaining of 8 out of 10 epigastric abdominal pain that radiates to his back and up to his chest.  No cough or fever, no nausea or vomiting, no diarrhea or constipation.  Patient does report feeling tingling on bilateral arms but that has resolved.  No headache or neck pain.  No prior history of heart disease.   Past Medical History:  Diagnosis Date  . DDD (degenerative disc disease), lumbar   . Dental bridge present    permanant - top left  . Headache    migraines - pinched nerve in neck - no mvmt limitations  . Hernia July 2014   umbilical  . Hypertension   . Kidney stones 04/2019    Patient Active Problem List   Diagnosis Date Noted  . Cutaneous abscess of right lower extremity 05/19/2019  . 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 Ruptured lumbar disc 10/09/2017  . Rhinitis medicamentosa 07/06/2015  . DDD (degenerative disc disease), lumbar 07/20/2014  . Hyperlipidemia 07/20/2014  . Hypertension 07/20/2014  . DDD (degenerative disc disease), cervical 07/20/2014  . Umbilical hernia  08/31/2012    Past Surgical History:  Procedure Laterality Date  . BACK SURGERY  2003, 2007, 2009, 2012   Dr Trey SailorsMark Roy  . HERNIA REPAIR  2014   umbilical hernia  . KNEE SURGERY Right 1997, 2001   torn miniscus  . LUMBAR DISC SURGERY     X 3 with hardware  . NASAL TURBINATE REDUCTION Bilateral 10/26/2015   Procedure: TURBINATE REDUCTION/SUBMUCOSAL RESECTION;  Surgeon: Linus Salmonshapman McQueen, MD;  [facility];  Service: ENT;  Laterality: Bilateral;  . SEPTOPLASTY Bilateral 10/26/2015   Procedure: SEPTOPLASTY;  Surgeon: Linus Salmonshapman McQueen, MD;  [facility];  Service: ENT;  Laterality: Bilateral;    Prior to Admission medications   Medication Sig Start Date End Date Taking? Authorizing Provider  cyclobenzaprine (FLEXERIL) 10 MG tablet Take 10 mg by mouth 3 (three) times daily as needed for muscle spasms.    [provider]  gabapentin (NEURONTIN) 300 MG capsule Take 1 capsule (300 mg total) by mouth 3 (three) times daily. 11/13/17 11/13/18  Phineas SemenGoodman, Graydon, MD  HYDROcodone-acetaminophen (NORCO/VICODIN) 5-325 MG tablet Take 1 tablet by mouth every 8 (eight) hours as needed for moderate pain.    [provider]  OXYCODONE HCL PO Take by mouth. PRN    [provider]  sulfamethoxazole-trimethoprim (BACTRIM) 400-80 MG tablet Take 2 tablets by mouth 2 (two) times daily. 05/19/19   Valentino NoseMartinez, Jessica A, NP  triamcinolone (NASACORT) 55 MCG/ACT AERO nasal inhaler Place 2 sprays into the  nose daily. 12/22/17   Olevia Perches P, DO    Allergies Other, Amlodipine, Celebrex [celecoxib], and Latex  Family History  Problem Relation Age of Onset  . Heart Problems Father   . Cancer Mother        pancreatic  . Heart Problems Maternal Grandfather   . Heart Problems Paternal Grandfather     Social History Social History   Tobacco Use  . Smoking status: Never Smoker  . Smokeless tobacco: Never Used  Vaping Use  . Vaping Use: Never used    Substance Use Topics  . Alcohol use: No  . Drug use: No    Review of Systems  Constitutional: Negative for fever. + syncope Eyes: Negative for visual changes. ENT: Negative for sore throat. Neck: No neck pain  Cardiovascular: + chest pain. Respiratory: Negative for shortness of breath. Gastrointestinal: + upper abdominal pain. No vomiting or diarrhea. Genitourinary: Negative for dysuria. Musculoskeletal: + back pain. Skin: Negative for rash. Neurological: Negative for headaches, weakness or numbness. Psych: No SI or HI  ____________________________________________   PHYSICAL EXAM:  VITAL SIGNS: ED Triage Vitals  Enc Vitals Group     BP 10/01/19 0047 (!) 146/102     Pulse Rate 10/01/19 0047 (!) 108     Resp 10/01/19 0047 14     Temp 10/01/19 0047 98.2 F (36.8 C)     Temp Source 10/01/19 0047 Oral     SpO2 10/01/19 0047 96 %     Weight 10/01/19 0048 225 lb (102.1 kg)     Height 10/01/19 0048 5\' 10"  (1.778 m)     Head Circumference --      Peak Flow --      Pain Score 10/01/19 0048 7     Pain Loc --      Pain Edu? --      Excl. in GC? --     Constitutional: Alert and oriented. Well appearing and in no apparent distress. HEENT:      Head: Normocephalic and atraumatic.         Eyes: Conjunctivae are normal. Sclera is non-icteric.       Mouth/Throat: Mucous membranes are moist.       Neck: Supple with no signs of meningismus. Cardiovascular: Irregularly irregular rhythm with tachycardic rate, no murmurs.   Respiratory: Normal respiratory effort. Lungs are clear to auscultation bilaterally. No wheezes, crackles, or rhonchi.  Gastrointestinal: Soft, tender to palpation epigastric region, and non distended with positive bowel sounds. No rebound or guarding. Musculoskeletal: No edema, cyanosis, or erythema of extremities. Neurologic: Normal speech and language. Face is symmetric. Moving all extremities. No gross focal neurologic deficits are appreciated. Skin: Skin is  warm, dry and intact. No rash noted. Psychiatric: Mood and affect are normal. Speech and behavior are normal.  ____________________________________________   LABS (all labs ordered are listed, but only abnormal results are displayed)  Labs Reviewed  COMPREHENSIVE METABOLIC PANEL - Abnormal; Notable for the following components:      Result Value   Glucose, Bld 142 (*)    All other components within normal limits  CBC WITH DIFFERENTIAL/PLATELET  LIPASE, BLOOD  PROTIME-INR  APTT  TROPONIN I (HIGH SENSITIVITY)  TROPONIN I (HIGH SENSITIVITY)   ____________________________________________  EKG  ED ECG REPORT I, 10/03/19, the attending physician, personally viewed and interpreted this ECG.  Atrial fibrillation, rate of 109, normal intervals, normal axis, no ST elevations or depressions.  New when compared to prior. ____________________________________________  RADIOLOGY  I have  personally reviewed the images performed during this visit and I agree with the Radiologist's read.   Interpretation by Radiologist:  CLINICAL DATA: Chest pain unresponsive  EXAM: CT ANGIOGRAPHY CHEST, ABDOMEN AND PELVIS  TECHNIQUE: Non-contrast CT of the chest was initially obtained.  Multidetector CT imaging through the chest, abdomen and pelvis was performed using the standard protocol during bolus administration of intravenous contrast. Multiplanar reconstructed images and MIPs were obtained and reviewed to evaluate the vascular anatomy.  CONTRAST: OMNIPAQUE IOHEXOL 350 MG/ML SOLN  COMPARISON: Chest x-ray 06/09/2017, CT 5 1 2015  FINDINGS: CTA CHEST FINDINGS  Cardiovascular: Non contrasted images of the chest demonstrate no acute intramural hematoma. Aorta is nonaneurysmal. No dissection is seen. Cardiac size within normal limits. No pericardial effusion  Mediastinum/Nodes: No enlarged mediastinal, hilar, or axillary lymph nodes. Thyroid gland, trachea, and esophagus  demonstrate no significant findings.  Lungs/Pleura: Lungs are clear. No pleural effusion or pneumothorax.  Musculoskeletal: No chest wall abnormality. No acute or significant osseous findings.  Review of the MIP images confirms the above findings.  CTA ABDOMEN AND PELVIS FINDINGS  VASCULAR  Aorta: Normal caliber aorta without aneurysm, dissection, vasculitis or significant stenosis.  Celiac: Arises from a common trunk with the SMA. Negative for occlusion stenosis or aneurysm.  SMA: Arises from a common trunk with the celiac artery. Negative for occlusion stenosis or aneurysm.  Renals: Single right and single left renal arteries are patent.  IMA: Patent without evidence of aneurysm, dissection, vasculitis or significant stenosis.  Inflow: Patent without evidence of aneurysm, dissection, vasculitis or significant stenosis.  Veins: No obvious venous abnormality within the limitations of this arterial phase study.  Review of the MIP images confirms the above findings.  NON-VASCULAR  Hepatobiliary: Calcified gallstone. Cyst in the left hepatic lobe. No biliary dilatation  Pancreas: Unremarkable. No pancreatic ductal dilatation or surrounding inflammatory changes.  Spleen: Normal in size without focal abnormality.  Adrenals/Urinary Tract: Adrenal glands are normal. Subcentimeter hypodensities within the kidneys too small to further characterize. No hydronephrosis. 4 mm stone lower pole right kidney. Bladder is normal.  Stomach/Bowel: Stomach is within normal limits. Appendix appears normal. No evidence of bowel wall thickening, distention, or inflammatory changes.  Lymphatic: No significantly enlarged lymph nodes  Reproductive: Prostate is unremarkable.  Other: Negative for free air or free fluid  Musculoskeletal: No acute or significant osseous findings.  Review of the MIP images confirms the above findings.  IMPRESSION: 1. Negative for acute aortic  dissection or aneurysm. Clear lung fields. No significant vascular disease within the abdomen or pelvis. 2. No CT evidence for acute intra-abdominal or pelvic abnormality. 3. Gallstone. Nonobstructing right kidney stone.   Electronically Signed By: Jasmine Pang M.D. On: 10/01/2019 02:42  ____________________________________________   PROCEDURES  Procedure(s) performed:yes .1-3 Lead EKG Interpretation Performed by: Nita Sickle, MD Authorized by: Nita Sickle, MD     Interpretation: abnormal     ECG rate assessment: tachycardic     Rhythm: atrial fibrillation     Critical Care performed: yes  CRITICAL CARE Performed by: Nita Sickle  ?  Total critical care time: 35 min  Critical care time was exclusive of separately billable procedures and treating other patients.  Critical care was necessary to treat or prevent imminent or life-threatening deterioration.  Critical care was time spent personally by me on the following activities: development of treatment plan with patient and/or surrogate as well as nursing, discussions with consultants, evaluation of patient's response to treatment, examination of patient, obtaining history from  patient or surrogate, ordering and performing treatments and interventions, ordering and review of laboratory studies, ordering and review of radiographic studies, pulse oximetry and re-evaluation of patient's condition.  ____________________________________________   INITIAL IMPRESSION / ASSESSMENT AND PLAN / ED COURSE   49 y.o. male with history of hypertension, kidney stones, umbilical hernia, and degenerative disc disease who presents for evaluation of syncope, chest pain, back pain, and epigastric abdominal pain.  Patient arrives in A. fib with ventricular rate in the low 100s.  No history of such.  Abdomen with mild epigastric tenderness but no rebound or guarding, has strong pulses in all 4 extremities, otherwise complete  neurologically intact.  Ddx dissection vs ACS vs dysrhythmia versus PE.  History gathered from patient and his wife at bedside.  We will get a CT dissection, morphine and Zofran for pain control.  Will check labs. Old medical records reviewed.  Plan discussed with both husband and his wife.  _________________________ 4:09 AM on 10/01/2019 -----------------------------------------  CTA negative for dissection, PE, or any other acute findings, confirmed by radiology.  Troponin x2 -.  After receiving IV magnesium patient converted out of A. fib.  Repeat EKG showing normal sinus rhythm with no acute ischemic changes.  Patient still complaining of indigestion which she describes as burning sensation in his chest.  LFTs and lipase are within normal limits, abdomen remains nontender.  Recommended admission however patient prefers to go home and follow-up with cardiology as an outpatient.  He did accept staying for a GI cocktail and Pepcid.  Will reassess after that.  Discussed risks of outpatient follow-up versus inpatient evaluation with patient and his wife.  Patient continues to request discharge and outpatient follow-up.   _________________________ 4:34 AM on 10/01/2019 -----------------------------------------  Patient reports resolution of his indigestion and chest pain after IV Pepcid and a GI cocktail. Remains well-appearing with an EKG showing normal sinus rhythm. Recommended close follow-up with cardiology and discussed my standard return precautions _____________________________________________ Please note:  Patient was evaluated in Emergency Department today for the symptoms described in the history of present illness. Patient was evaluated in the context of the global COVID-19 pandemic, which necessitated consideration that the patient might be at risk for infection with the SARS-CoV-2 virus that causes COVID-19. Institutional protocols and algorithms that pertain to the evaluation of  patients at risk for COVID-19 are in a state of rapid change based on information released by regulatory bodies including the CDC and federal and state organizations. These policies and algorithms were followed during the patient's care in the ED.  Some ED evaluations and interventions may be delayed as a result of limited staffing during the pandemic.   [place] Controlled Substance Database was reviewed by me. ____________________________________________   FINAL CLINICAL IMPRESSION(S) / ED DIAGNOSES   Final diagnoses:  Syncope, unspecified syncope type  Chest pain, unspecified type  Paroxysmal A-fib (HCC)      NEW MEDICATIONS STARTED DURING THIS VISIT:  ED Discharge Orders    None       Note:  This document was prepared using Dragon voice recognition software and may include unintentional dictation errors.    Don Perking, [place], MD 10/01/19 [id number]    Nita Sickle, MD 10/01/19 [phone]

## 2019-10-01 NOTE — ED Triage Notes (Addendum)
Pt from home via [facility] with an episode of unresponsiveness witnessed by wife. Pt reports numbness in both arms and loss of hearing in R ear during episode. Pt's wife sternal rubbed pt without response, pt became responsive after 1 minute.  AFIB on EKG, no cardiac hx. Pt c/o CP and upper abdominal pain of 7/10.  Pt took 486mg  of aspirin at home.

## 2019-12-15 ENCOUNTER — Other Ambulatory Visit: Payer: Self-pay

## 2019-12-15 ENCOUNTER — Emergency Department
Admission: EM | Admit: 2019-12-15 | Discharge: 2019-12-16 | Disposition: A | Discharge status: Home / Self Care | Payer: BLUE CROSS/BLUE SHIELD | Attending: Emergency Medicine | Admitting: Emergency Medicine

## 2019-12-15 ENCOUNTER — Emergency Department: Payer: BLUE CROSS/BLUE SHIELD

## 2019-12-15 DIAGNOSIS — R519 Headache, unspecified: Secondary | ICD-10-CM | POA: Diagnosis not present

## 2019-12-15 DIAGNOSIS — M549 Dorsalgia, unspecified: Secondary | ICD-10-CM | POA: Diagnosis not present

## 2019-12-15 DIAGNOSIS — I10 Essential (primary) hypertension: Secondary | ICD-10-CM | POA: Diagnosis not present

## 2019-12-15 DIAGNOSIS — M545 Low back pain, unspecified: Secondary | ICD-10-CM

## 2019-12-15 LAB — CBC WITH DIFFERENTIAL/PLATELET
Abs Immature Granulocytes: 0.05 10*3/uL (ref 0.00–0.07)
Basophils Absolute: 0 10*3/uL (ref 0.0–0.1)
Basophils Relative: 0 %
Eosinophils Absolute: 0 10*3/uL (ref 0.0–0.5)
Eosinophils Relative: 0 %
HCT: 49.3 % (ref 39.0–52.0)
Hemoglobin: 16.9 g/dL (ref 13.0–17.0)
Immature Granulocytes: 1 %
Lymphocytes Relative: 7 %
Lymphs Abs: 0.5 10*3/uL — ABNORMAL LOW (ref 0.7–4.0)
MCH: 32.3 pg (ref 26.0–34.0)
MCHC: 34.3 g/dL (ref 30.0–36.0)
MCV: 94.1 fL (ref 80.0–100.0)
Monocytes Absolute: 0.1 10*3/uL (ref 0.1–1.0)
Monocytes Relative: 2 %
Neutro Abs: 6.3 10*3/uL (ref 1.7–7.7)
Neutrophils Relative %: 90 %
Platelets: 302 10*3/uL (ref 150–400)
RBC: 5.24 MIL/uL (ref 4.22–5.81)
RDW: 11.9 % (ref 11.5–15.5)
WBC: 7 10*3/uL (ref 4.0–10.5)
nRBC: 0 % (ref 0.0–0.2)

## 2019-12-15 LAB — TROPONIN I (HIGH SENSITIVITY): Troponin I (High Sensitivity): 8 ng/L (ref <18)

## 2019-12-15 LAB — COMPREHENSIVE METABOLIC PANEL
ALT: 36 U/L (ref 0–44)
AST: 34 U/L (ref 15–41)
Albumin: 4.4 g/dL (ref 3.5–5.0)
Alkaline Phosphatase: 84 U/L (ref 38–126)
Anion gap: 11 (ref 5–15)
BUN: 15 mg/dL (ref 6–20)
CO2: 22 mmol/L (ref 22–32)
Calcium: 9.2 mg/dL (ref 8.9–10.3)
Chloride: 101 mmol/L (ref 98–111)
Creatinine, Ser: 1.18 mg/dL (ref 0.61–1.24)
GFR, Estimated: >60 mL/min (ref >60)
Glucose, Bld: 280 mg/dL — ABNORMAL HIGH (ref 70–99)
Potassium: 4.4 mmol/L (ref 3.5–5.1)
Sodium: 134 mmol/L — ABNORMAL LOW (ref 135–145)
Total Bilirubin: 0.8 mg/dL (ref 0.3–1.2)
Total Protein: 7.8 g/dL (ref 6.5–8.1)

## 2019-12-15 MED ORDER — HYDROMORPHONE HCL 1 MG/ML IJ SOLN
1.0000 mg | Freq: Once | INTRAMUSCULAR | Status: AC
  Administered 2019-12-15: 1 mg via INTRAVENOUS
  Filled 2019-12-15: qty 1

## 2019-12-15 MED ORDER — ONDANSETRON HCL 4 MG/2ML IJ SOLN
4.0000 mg | Freq: Once | INTRAMUSCULAR | Status: AC
  Administered 2019-12-15: 4 mg via INTRAVENOUS
  Filled 2019-12-15: qty 2

## 2019-12-15 MED ORDER — METOCLOPRAMIDE HCL 5 MG/ML IJ SOLN
10.0000 mg | Freq: Once | INTRAMUSCULAR | Status: AC
  Administered 2019-12-15: 10 mg via INTRAVENOUS
  Filled 2019-12-15: qty 2

## 2019-12-15 MED ORDER — DIPHENHYDRAMINE HCL 50 MG/ML IJ SOLN
25.0000 mg | Freq: Once | INTRAMUSCULAR | Status: AC
  Administered 2019-12-15: 25 mg via INTRAVENOUS
  Filled 2019-12-15: qty 1

## 2019-12-15 MED ORDER — SODIUM CHLORIDE 0.9 % IV BOLUS
1000.0000 mL | Freq: Once | INTRAVENOUS | Status: AC
  Administered 2019-12-15: 1000 mL via INTRAVENOUS

## 2019-12-15 NOTE — ED Notes (Signed)
Pt placed on 2L O2, d/t 87% on RA. Pt's sats increased to 94% on 2L. EDP aware.

## 2019-12-15 NOTE — ED Triage Notes (Signed)
EMS brings pt in from home for c/o back pain; hx of same & inj received this am for such

## 2019-12-15 NOTE — ED Provider Notes (Signed)
[facility] Emergency Department Provider Note  Time seen: 9:27 PM  I have reviewed the triage vital signs and the nursing notes.   HISTORY  Chief Complaint Back Pain   HPI [name] is a 49 y.o. male with a past medical history of chronic back pain, hypertension, presents to the emergency department for worsening back pain and high blood pressure.  According to the patient for the last few days he has had worsening back pain, had a corticosteroid injection performed earlier today but states the pain was unbearable so he came to the emergency department.  Patient states at home he checks his blood pressure which was significantly elevated, patient states this is occurred in the past with severe pain.  Patient also states he is experiencing a migraine headache today which he relates to his high blood pressure and back pain as well.  Patient has prescription medications for blood pressure and pain that they were not helping so he came to the emergency department.  Patient denies any fever or chest pain.  Denies abdominal pain vomiting or diarrhea.  Largely negative review of systems otherwise.  No weakness or numbness.   Past Medical History:  Diagnosis Date  . DDD (degenerative disc disease), lumbar   . Dental bridge present    permanant - top left  . Headache    migraines - pinched nerve in neck - no mvmt limitations  . Hernia July 2014   umbilical  . Hypertension   . Kidney stones 04/2019    Patient Active Problem List   Diagnosis Date Noted  . Cutaneous abscess of right lower extremity 05/19/2019  . 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 Ruptured lumbar disc 10/09/2017  . Rhinitis medicamentosa 07/06/2015  . DDD (degenerative disc disease), lumbar 07/20/2014  . Hyperlipidemia 07/20/2014  . Hypertension 07/20/2014  . DDD (degenerative disc disease), cervical 07/20/2014  . Umbilical hernia 08/31/2012    Past Surgical History:  Procedure  Laterality Date  . BACK SURGERY  2003, 2007, 2009, 2012   Dr Trey Sailors  . HERNIA REPAIR  2014   umbilical hernia  . KNEE SURGERY Right 1997, 2001   torn miniscus  . LUMBAR DISC SURGERY     X 3 with hardware  . NASAL TURBINATE REDUCTION Bilateral 10/26/2015   Procedure: TURBINATE REDUCTION/SUBMUCOSAL RESECTION;  Surgeon: Linus Salmons, MD;  Location: [facility];  Service: ENT;  Laterality: Bilateral;  . SEPTOPLASTY Bilateral 10/26/2015   Procedure: SEPTOPLASTY;  Surgeon: Linus Salmons, MD;  Location: [facility];  Service: ENT;  Laterality: Bilateral;    Prior to Admission medications   Medication Sig Start Date End Date Taking? Authorizing Provider  cyclobenzaprine (FLEXERIL) 10 MG tablet Take 10 mg by mouth 3 (three) times daily as needed for muscle spasms.    [provider]  gabapentin (NEURONTIN) 300 MG capsule Take 1 capsule (300 mg total) by mouth 3 (three) times daily. 11/13/17 11/13/18  Phineas Semen, MD  HYDROcodone-acetaminophen (NORCO/VICODIN) 5-325 MG tablet Take 1 tablet by mouth every 8 (eight) hours as needed for moderate pain.    [provider]  OXYCODONE HCL PO Take by mouth. PRN    [provider]  sulfamethoxazole-trimethoprim (BACTRIM) 400-80 MG tablet Take 2 tablets by mouth 2 (two) times daily. 05/19/19   Valentino Nose, NP  triamcinolone (NASACORT) 55 MCG/ACT AERO nasal inhaler Place 2 sprays into the nose daily. 12/22/17   Olevia Perches P, DO    Allergies  Allergen Reactions  . Other Rash    Neoprene  . Amlodipine     Reflux.  . Celebrex [Celecoxib]     GI issues  . Latex Rash    Only when wearing Powdered gloves    Family History  Problem Relation Age of Onset  . Heart Problems Father   . Cancer Mother        pancreatic  . Heart Problems Maternal Grandfather   . Heart Problems Paternal Grandfather     Social History Social History   Tobacco Use  . Smoking status: Never Smoker  . Smokeless  tobacco: Never Used  Vaping Use  . Vaping Use: Never used  Substance Use Topics  . Alcohol use: No  . Drug use: No    Review of Systems Constitutional: Negative for fever.  High blood pressure. Cardiovascular: Negative for chest pain. Respiratory: Negative for shortness of breath. Gastrointestinal: Negative for abdominal pain, vomiting and diarrhea. Genitourinary: Negative for urinary compaints Musculoskeletal: Acute on chronic back pain.  Denies trauma. Skin: Negative for skin complaints  Neurological: Moderate headache All other ROS negative  ____________________________________________   PHYSICAL EXAM:  VITAL SIGNS: ED Triage Vitals  Enc Vitals Group     BP 12/15/19 2010 (!) 144/100     Pulse Rate 12/15/19 2010 (!) 118     Resp 12/15/19 2010 20     Temp 12/15/19 2010 98.7 F (37.1 C)     Temp Source 12/15/19 2010 Oral     SpO2 12/15/19 2010 95 %     Weight 12/15/19 2011 225 lb (102.1 kg)     Height 12/15/19 2011 5\' 10"  (1.778 m)     Head Circumference --      Peak Flow --      Pain Score 12/15/19 2010 9     Pain Loc --      Pain Edu? --      Excl. in GC? --     Constitutional: Alert and oriented. Well appearing and in no distress. Eyes: Normal exam ENT      Head: Normocephalic and atraumatic.      Mouth/Throat: Mucous membranes are moist. Cardiovascular: Normal rate, regular rhythm.  Respiratory: Normal respiratory effort without tachypnea nor retractions. Breath sounds are clear  Gastrointestinal: Soft and nontender. No distention.   Musculoskeletal: Nontender with normal range of motion in all extremities.  Neurologic:  Normal speech and language. No gross focal neurologic deficits  Skin:  Skin is warm, dry and intact.  Psychiatric: Mood and affect are normal.   ____________________________________________    EKG  EKG viewed and interpreted by myself shows sinus tachycardia 119 bpm with a narrow QRS, normal axis, normal intervals, nonspecific ST  changes.  ____________________________________________    RADIOLOGY  Chest x-ray is negative  ____________________________________________   INITIAL IMPRESSION / ASSESSMENT AND PLAN / ED COURSE  Pertinent labs & imaging results that were available during my care of the patient were reviewed by me and considered in my medical decision making (see chart for details).   Patient presents to the emergency department for high blood pressure worsening back pain and a migraine headache.  Patient states a history of chronic back pain, has tried his home medications without relief.  We will treat with Dilaudid, for his headache we will treat with Reglan Benadryl, IV hydrate.  Patient's lab work has resulted largely within normal limits besides elevated blood sugar of 280.  Patient states he ate approximately 1 and half to 2 hours  prior to arrival.  We will recheck a CBG and IV hydrate.  Patient denies history of diabetes.  Did receive a steroid injection today.  Patient's blood pressures come down after pain medication currently 130 systolic over 90 diastolic.  Headache is improved as well.  We will discharge patient home with instructions to have labs rechecked by his PCP in 1 week to rule out diabetes/borderline diabetes.  Patient agreeable to plan of care.  JOANGEL VANOSDOL was evaluated in Emergency Department on 12/15/2019 for the symptoms described in the history of present illness. He was evaluated in the context of the global COVID-19 pandemic, which necessitated consideration that the patient might be at risk for infection with the SARS-CoV-2 virus that causes COVID-19. Institutional protocols and algorithms that pertain to the evaluation of patients at risk for COVID-19 are in a state of rapid change based on information released by regulatory bodies including the CDC and federal and state organizations. These policies and algorithms were followed during the patient's care in the  ED.  ____________________________________________   FINAL CLINICAL IMPRESSION(S) / ED DIAGNOSES  Hypertension Headache Back pain   Minna Antis, MD 12/15/19 2324

## 2019-12-15 NOTE — ED Triage Notes (Signed)
PT arrives [facility] from home w cc of high blood pressure and pt states "I was hearing my heartbeat". Pt reports mid back pain radiating around shoulder, now mid chest pain  6/10. States has had headache since calling  EMS. Pt has elevated BP at home, 173/113. Pt states was getting dizzy walking.

## 2019-12-15 NOTE — Discharge Instructions (Addendum)
Please follow-up with your doctor in 1 week to have your lab work rechecked including your blood sugar.  Please drink plenty of nonsugary fluids.  Return to the emergency department for any symptom personally concerning to yourself.

## 2020-01-09 ENCOUNTER — Telehealth: Payer: Self-pay

## 2020-01-09 NOTE — Telephone Encounter (Signed)
Received fax for clearance for patient. Please call and schedule. Surgery is not scheduled yet.

## 2020-01-09 NOTE — Telephone Encounter (Signed)
Form placed in the incomplete bin until appointment.

## 2020-01-16 NOTE — Telephone Encounter (Signed)
Was this patient ever contacted for clearance?

## 2020-01-16 NOTE — Telephone Encounter (Signed)
Called pt he states that he is no longer having surgery and that was last month

## 2021-06-10 ENCOUNTER — Emergency Department: Payer: BLUE CROSS/BLUE SHIELD

## 2021-06-10 ENCOUNTER — Other Ambulatory Visit: Payer: Self-pay

## 2021-06-10 ENCOUNTER — Emergency Department
Admission: EM | Admit: 2021-06-10 | Discharge: 2021-06-11 | Disposition: A | Discharge status: Home / Self Care | Payer: BLUE CROSS/BLUE SHIELD | Attending: Emergency Medicine | Admitting: Emergency Medicine

## 2021-06-10 DIAGNOSIS — I10 Essential (primary) hypertension: Secondary | ICD-10-CM | POA: Diagnosis not present

## 2021-06-10 DIAGNOSIS — R079 Chest pain, unspecified: Secondary | ICD-10-CM | POA: Insufficient documentation

## 2021-06-10 DIAGNOSIS — K30 Functional dyspepsia: Secondary | ICD-10-CM | POA: Diagnosis not present

## 2021-06-10 DIAGNOSIS — R1013 Epigastric pain: Secondary | ICD-10-CM | POA: Diagnosis present

## 2021-06-10 LAB — CBC
HCT: 46.2 % (ref 39.0–52.0)
Hemoglobin: 15.2 g/dL (ref 13.0–17.0)
MCH: 31 pg (ref 26.0–34.0)
MCHC: 32.9 g/dL (ref 30.0–36.0)
MCV: 94.3 fL (ref 80.0–100.0)
Platelets: 301 10*3/uL (ref 150–400)
RBC: 4.9 MIL/uL (ref 4.22–5.81)
RDW: 12.2 % (ref 11.5–15.5)
WBC: 8.5 10*3/uL (ref 4.0–10.5)
nRBC: 0 % (ref 0.0–0.2)

## 2021-06-10 LAB — TROPONIN I (HIGH SENSITIVITY)
Troponin I (High Sensitivity): 3 ng/L (ref <18)
Troponin I (High Sensitivity): 3 ng/L (ref <18)

## 2021-06-10 NOTE — ED Provider Triage Note (Signed)
Emergency Medicine Provider Triage Evaluation Note ? ?[name]  a 51 y.o. male  was evaluated in triage.  Pt complains of midsternal chest pain with onset today.  Patient is 2 weeks status postcholecystectomy and denies any complication related to the surgery or postop state.  He reports midsternal pain and pressure in nature.  He presents via EMS with reports of aspirin and 2 nitro sprays given with some relief. ? ?Review of Systems  ?Positive: CP ?Negative: NVD ? ?Physical Exam  ?BP (!) 135/96   Pulse 84   Temp 98.3 ?F (36.8 ?C) (Oral)   Resp 16   Ht 5\' 10"  (1.778 m)   Wt 97.1 kg   SpO2 94%   BMI 30.71 kg/m?  ?Gen:   Awake, no distress   ?Resp:  Normal effort CTA ?MSK:   Moves extremities without difficulty  ?CVS:  RRR ? ?Medical Decision Making  ?Medically screening exam initiated at 7:46 PM.  Appropriate orders placed.  [name] was informed that the remainder of the evaluation will be completed by another provider, this initial triage assessment does not replace that evaluation, and the importance of remaining in the ED until their evaluation is complete. ? ?Patient to the ED for evaluation of midsternal chest pain with onset earlier today.  Patient with a history of hypertension, DDD, and kidney stones. ?  ?Leota Sauers, PA-C ?06/10/21 1949 ? ?

## 2021-06-10 NOTE — ED Triage Notes (Signed)
Pt presents via EMS c/o midsternal chest pain starting today. Reports s/p cholecystectomy x2 weeks ago. Reports midsternal pressure type pain. Given 324 ASA and 2 nitro sprays by EMS with relief in pain. A&O x4.  ?

## 2021-06-11 ENCOUNTER — Emergency Department: Payer: BLUE CROSS/BLUE SHIELD

## 2021-06-11 LAB — COMPREHENSIVE METABOLIC PANEL
ALT: 24 U/L (ref 0–44)
AST: 23 U/L (ref 15–41)
Albumin: 4.2 g/dL (ref 3.5–5.0)
Alkaline Phosphatase: 71 U/L (ref 38–126)
Anion gap: 5 (ref 5–15)
BUN: 15 mg/dL (ref 6–20)
CO2: 22 mmol/L (ref 22–32)
Calcium: 9.3 mg/dL (ref 8.9–10.3)
Chloride: 108 mmol/L (ref 98–111)
Creatinine, Ser: 0.88 mg/dL (ref 0.61–1.24)
GFR, Estimated: >60 mL/min (ref >60)
Glucose, Bld: 109 mg/dL — ABNORMAL HIGH (ref 70–99)
Potassium: 4.4 mmol/L (ref 3.5–5.1)
Sodium: 135 mmol/L (ref 135–145)
Total Bilirubin: 1 mg/dL (ref 0.3–1.2)
Total Protein: 7.1 g/dL (ref 6.5–8.1)

## 2021-06-11 LAB — LIPASE, BLOOD: Lipase: 44 U/L (ref 11–51)

## 2021-06-11 MED ORDER — FAMOTIDINE 20 MG PO TABS
20.0000 mg | ORAL_TABLET | Freq: Two times a day (BID) | ORAL | 0 refills | Status: AC
Start: 2021-06-11 — End: 2021-07-11

## 2021-06-11 MED ORDER — MAALOX MAX 400-400-40 MG/5ML PO SUSP: 5.0000 mL | Freq: Four times a day (QID) | ORAL | 0 refills | Status: AC | PRN

## 2021-06-11 MED ORDER — FAMOTIDINE 20 MG PO TABS: 20.0000 mg | ORAL_TABLET | Freq: Two times a day (BID) | ORAL | 0 refills | Status: DC

## 2021-06-11 MED ORDER — ALUM & MAG HYDROXIDE-SIMETH 200-200-20 MG/5ML PO SUSP
30.0000 mL | Freq: Once | ORAL | Status: AC
  Administered 2021-06-11: 30 mL via ORAL
  Filled 2021-06-11: qty 30

## 2021-06-11 MED ORDER — MAALOX MAX 400-400-40 MG/5ML PO SUSP: 5.0000 mL | Freq: Four times a day (QID) | ORAL | 0 refills | Status: DC | PRN

## 2021-06-11 MED ORDER — FENTANYL CITRATE PF 50 MCG/ML IJ SOSY
50.0000 ug | PREFILLED_SYRINGE | Freq: Once | INTRAMUSCULAR | Status: AC
  Administered 2021-06-11: 50 ug via INTRAVENOUS
  Filled 2021-06-11: qty 1

## 2021-06-11 MED ORDER — IOHEXOL 350 MG/ML SOLN
100.0000 mL | Freq: Once | INTRAVENOUS | Status: AC | PRN
  Administered 2021-06-11: 100 mL via INTRAVENOUS

## 2021-06-11 MED ORDER — FAMOTIDINE IN NACL 20-0.9 MG/50ML-% IV SOLN
20.0000 mg | Freq: Once | INTRAVENOUS | Status: AC
Start: 2021-06-11 — End: 2021-06-11
  Administered 2021-06-11: 20 mg via INTRAVENOUS
  Filled 2021-06-11: qty 50

## 2021-06-11 MED ORDER — ONDANSETRON HCL 4 MG/2ML IJ SOLN
4.0000 mg | Freq: Once | INTRAMUSCULAR | Status: AC
  Administered 2021-06-11: 4 mg via INTRAVENOUS
  Filled 2021-06-11: qty 2

## 2021-06-11 NOTE — ED Provider Notes (Signed)
? ?[facility] ?Provider Note ? ? ? Event Date/Time  ? First MD Initiated Contact with Patient 06/10/21 2349   ?  (approximate) ? ? ?History  ? ?Chest Pain ? ? ?HPI ? ?[name] is a 51 y.o. male with a history of hypertension, kidney stones, umbilical hernia status postrepair, status post cholecystectomy on 05/23/2021 who presents for evaluation of epigastric pain.  Patient reports that his symptoms started today.  Is complaining of epigastric pressure-like severe pain that he describes as a pretty severe episode of indigestion.  The pain has been constant since this afternoon.  He has had some nausea with it but no vomiting, the pain is not worse after eating, no shortness of breath, no pleuritic chest pain.  He denies any personal history of heart disease but has family history of such.  No personal or family history of PE or DVT, leg pain or swelling, hemoptysis.  He has been moving his bowels.  He received nitro and aspirin per EMS.  He denies any changes in the pain with the nitroglycerin. ?  ? ? ?Past Medical History:  ?Diagnosis Date  ? DDD (degenerative disc disease), lumbar   ? Dental bridge present   ? permanant - top left  ? Headache   ? migraines - pinched nerve in neck - no mvmt limitations  ? Hernia July 2014  ? umbilical  ? Hypertension   ? Kidney stones 04/2019  ? ? ?Past Surgical History:  ?Procedure Laterality Date  ? BACK SURGERY  2003, 2007, 2009, 2012  ? Dr Trey Sailors  ? HERNIA REPAIR  2014  ? umbilical hernia  ? KNEE SURGERY Right 1997, 2001  ? torn miniscus  ? LUMBAR DISC SURGERY    ? X 3 with hardware  ? NASAL TURBINATE REDUCTION Bilateral 10/26/2015  ? Procedure: TURBINATE REDUCTION/SUBMUCOSAL RESECTION;  Surgeon: Linus Salmons, MD;  Location: [facility];  Service: ENT;  Laterality: Bilateral;  ? SEPTOPLASTY Bilateral 10/26/2015  ? Procedure: SEPTOPLASTY;  Surgeon: Linus Salmons, MD;  Location: [facility];  Service: ENT;  Laterality:  Bilateral;  ? ? ? ?Physical Exam  ? ?Triage Vital Signs: ?ED Triage Vitals  ?Enc Vitals Group  ?   BP 06/10/21 1934 (!) 135/96  ?   Pulse Rate 06/10/21 1934 84  ?   Resp 06/10/21 1934 16  ?   Temp 06/10/21 1934 98.3 ?F (36.8 ?C)  ?   Temp Source 06/10/21 1934 Oral  ?   SpO2 06/10/21 1934 94 %  ?   Weight 06/10/21 1936 214 lb (97.1 kg)  ?   Height 06/10/21 1936 5\' 10"  (1.778 m)  ?   Head Circumference --   ?   Peak Flow --   ?   Pain Score 06/10/21 1935 10  ?   Pain Loc --   ?   Pain Edu? --   ?   Excl. in GC? --   ? ? ?Most recent vital signs: ?Vitals:  ? 06/10/21 2148 06/11/21 0502  ?BP: (!) 142/101 (!) 139/98  ?Pulse: 72 71  ?Resp: 20 14  ?Temp:  97.9 ?F (36.6 ?C)  ?SpO2: 96% 98%  ? ? ? ?Constitutional: Alert and oriented. Well appearing and in no apparent distress. ?HEENT: ?     Head: Normocephalic and atraumatic.    ?     Eyes: Conjunctivae are normal. Sclera is non-icteric.  ?     Mouth/Throat: Mucous membranes are moist.  ?  Neck: Supple with no signs of meningismus. ?Cardiovascular: Regular rate and rhythm. No murmurs, gallops, or rubs. 2+ symmetrical distal pulses are present in all extremities.  ?Respiratory: Normal respiratory effort. Lungs are clear to auscultation bilaterally.  ?Gastrointestinal: Soft, tender to palpation epigastric region, and non distended with positive bowel sounds. No rebound or guarding. ?Genitourinary: No CVA tenderness. ?Musculoskeletal:  No edema, cyanosis, or erythema of extremities. ?Neurologic: Normal speech and language. Face is symmetric. Moving all extremities. No gross focal neurologic deficits are appreciated. ?Skin: Skin is warm, dry and intact. No rash noted. ?Psychiatric: Mood and affect are normal. Speech and behavior are normal. ? ?ED Results / Procedures / Treatments  ? ?Labs ?(all labs ordered are listed, but only abnormal results are displayed) ?Labs Reviewed  ?COMPREHENSIVE METABOLIC PANEL - Abnormal; Notable for the following components:  ?    Result Value  ?  Glucose, Bld 109 (*)   ? All other components within normal limits  ?CBC  ?LIPASE, BLOOD  ?TROPONIN I (HIGH SENSITIVITY)  ?TROPONIN I (HIGH SENSITIVITY)  ? ? ? ?EKG ? ?ED ECG REPORT ?I, Nita Sickle, the attending physician, personally viewed and interpreted this ECG. ? ?Sinus rhythm with a rate of 84, normal intervals, normal axis, no ST elevations or depressions. ? ?RADIOLOGY ?I, Nita Sickle, attending MD, have personally viewed and interpreted the images obtained during this visit as below: ? ?Chest x-ray negative ? ?CT chest, abdomen pelvis negative ?___________________________________________________ ?Interpretation by Radiologist:  ?DG Chest 2 View ? ?Result Date: 06/10/2021 ?CLINICAL DATA:  Chest pain. EXAM: CHEST - 2 VIEW COMPARISON:  Chest radiograph dated 12/15/2019. FINDINGS: No focal consolidation, pleural effusion or pneumothorax. The cardiac silhouette is within normal limits. No acute osseous pathology. Lower cervical ACDF field IMPRESSION: No active cardiopulmonary disease. Electronically Signed   By: Elgie Collard M.D.   On: 06/10/2021 20:36  ? ?CT Angio Chest PE W and/or Wo Contrast ? ?Result Date: 06/11/2021 ?CLINICAL DATA:  Chest pain. Concern for pulmonary embolism. Epigastric pain. EXAM: CT ANGIOGRAPHY CHEST CT ABDOMEN AND PELVIS WITH CONTRAST TECHNIQUE: Multidetector CT imaging of the chest was performed using the standard protocol during bolus administration of intravenous contrast. Multiplanar CT image reconstructions and MIPs were obtained to evaluate the vascular anatomy. Multidetector CT imaging of the abdomen and pelvis was performed using the standard protocol during bolus administration of intravenous contrast. RADIATION DOSE REDUCTION: This exam was performed according to the departmental dose-optimization program which includes automated exposure control, adjustment of the mA and/or kV according to patient size and/or use of iterative reconstruction technique. CONTRAST:   OMNIPAQUE IOHEXOL 350 MG/ML SOLN COMPARISON:  CT of the chest abdomen pelvis dated 10/01/2019. FINDINGS: CTA CHEST FINDINGS Cardiovascular: There is no cardiomegaly or pericardial effusion. The thoracic aorta is unremarkable. The origins of the great vessels of the aortic arch appear patent. No pulmonary artery embolus identified. Mediastinum/Nodes: There is no hilar or mediastinal adenopathy. The esophagus and the thyroid gland are grossly unremarkable. No mediastinal fluid collection. Lungs/Pleura: Minimal bibasilar subpleural dependent atelectasis. No focal consolidation, pleural effusion, or pneumothorax. The central airways are patent. Musculoskeletal: Lower cervical ACDF.  No acute osseous pathology. Review of the MIP images confirms the above findings. CT ABDOMEN and PELVIS FINDINGS No intra-abdominal free air or free fluid. Hepatobiliary: Probable fatty infiltration of the liver. Similar appearance of a 12 mm hypodense lesion in the left lobe of the liver most consistent with a cyst. No intrahepatic biliary ductal dilatation. The gallbladder is not visualized, likely  surgically absent. Faint high attenuating focus adjacent to the cystic duct, likely cholecystectomy changes. Pancreas: Unremarkable. No pancreatic ductal dilatation or surrounding inflammatory changes. Spleen: Normal in size without focal abnormality. Adrenals/Urinary Tract: The adrenal glands are unremarkable. There is no hydronephrosis on either side. There is symmetric enhancement and excretion of contrast by both kidneys. There is a 1 cm left renal upper pole cyst. The visualized ureters and urinary bladder appear unremarkable. Stomach/Bowel: There is sigmoid diverticulosis without active inflammatory changes. There is no bowel obstruction or active inflammation. The appendix is normal. Vascular/Lymphatic: The abdominal aorta and IVC are unremarkable. No portal venous gas. There is no adenopathy. Reproductive: The prostate and seminal  vesicles are grossly unremarkable. No pelvic mass. Other: None Musculoskeletal: Lower lumbar fusion.  No acute osseous pathology. Review of the MIP images confirms the above findings. IMPRESSION: 1. No acute i

## 2021-06-11 NOTE — ED Notes (Signed)
Patient wife went to sit in the car. Call if she is needed or any changes. ? ?Atlee Kluth  [phone] ?

## 2021-06-11 NOTE — ED Notes (Signed)
No rolling computer for hallway for patient to sign discharge signature pad and unable to print for hard copy.  Patient has no further questions about discharge. ?

## 2022-01-15 ENCOUNTER — Emergency Department
Admission: EM | Admit: 2022-01-15 | Discharge: 2022-01-16 | Disposition: A | Discharge status: Home / Self Care | Payer: BLUE CROSS/BLUE SHIELD | Attending: Emergency Medicine | Admitting: Emergency Medicine

## 2022-01-15 ENCOUNTER — Emergency Department: Payer: BLUE CROSS/BLUE SHIELD

## 2022-01-15 DIAGNOSIS — Z20822 Contact with and (suspected) exposure to covid-19: Secondary | ICD-10-CM | POA: Diagnosis not present

## 2022-01-15 DIAGNOSIS — R079 Chest pain, unspecified: Secondary | ICD-10-CM | POA: Diagnosis present

## 2022-01-15 DIAGNOSIS — J101 Influenza due to other identified influenza virus with other respiratory manifestations: Secondary | ICD-10-CM | POA: Insufficient documentation

## 2022-01-15 LAB — CBC WITH DIFFERENTIAL/PLATELET
Abs Immature Granulocytes: 0.05 10*3/uL (ref 0.00–0.07)
Basophils Absolute: 0 10*3/uL (ref 0.0–0.1)
Basophils Relative: 0 %
Eosinophils Absolute: 0 10*3/uL (ref 0.0–0.5)
Eosinophils Relative: 0 %
HCT: 49.8 % (ref 39.0–52.0)
Hemoglobin: 16.3 g/dL (ref 13.0–17.0)
Immature Granulocytes: 1 %
Lymphocytes Relative: 11 %
Lymphs Abs: 0.8 10*3/uL (ref 0.7–4.0)
MCH: 31 pg (ref 26.0–34.0)
MCHC: 32.7 g/dL (ref 30.0–36.0)
MCV: 94.7 fL (ref 80.0–100.0)
Monocytes Absolute: 0.7 10*3/uL (ref 0.1–1.0)
Monocytes Relative: 10 %
Neutro Abs: 5.7 10*3/uL (ref 1.7–7.7)
Neutrophils Relative %: 78 %
Platelets: 224 10*3/uL (ref 150–400)
RBC: 5.26 MIL/uL (ref 4.22–5.81)
RDW: 12 % (ref 11.5–15.5)
WBC: 7.3 10*3/uL (ref 4.0–10.5)
nRBC: 0 % (ref 0.0–0.2)

## 2022-01-15 NOTE — ED Notes (Signed)
This RN informed by registration that the patient had laid on the floor in the lobby and was having chest pain. This RN and EDT (Hailey) assisted the patient in wheelchair and he was taken triage room for repeat VS, EKG, and Labs. First nurse made aware.

## 2022-01-15 NOTE — ED Triage Notes (Addendum)
Pt presents via EMS with complaints of generalized body aches, chills, intermittent fevers, cough, and nasal congestion for the last 3 days. Pt has been using tylenol and nyquil to help with is sx with minimal improvement. Denies CP or SOB.    VSS with EMS - took tylenol around 2000. CBG 131

## 2022-01-15 NOTE — ED Notes (Signed)
Pt c/o mid CP radiating into back x with "numbness to arms up to my ears"; pt taken to triage for reassessment and protocols

## 2022-01-16 LAB — RESP PANEL BY RT-PCR (RSV, FLU A&B, COVID)  RVPGX2
Influenza A by PCR: POSITIVE — AB
Influenza B by PCR: NEGATIVE
Resp Syncytial Virus by PCR: NEGATIVE
SARS Coronavirus 2 by RT PCR: NEGATIVE

## 2022-01-16 LAB — COMPREHENSIVE METABOLIC PANEL
ALT: 33 U/L (ref 0–44)
AST: 42 U/L — ABNORMAL HIGH (ref 15–41)
Albumin: 4.3 g/dL (ref 3.5–5.0)
Alkaline Phosphatase: 82 U/L (ref 38–126)
Anion gap: 11 (ref 5–15)
BUN: 12 mg/dL (ref 6–20)
CO2: 24 mmol/L (ref 22–32)
Calcium: 8.9 mg/dL (ref 8.9–10.3)
Chloride: 103 mmol/L (ref 98–111)
Creatinine, Ser: 1.32 mg/dL — ABNORMAL HIGH (ref 0.61–1.24)
GFR, Estimated: >60 mL/min (ref >60)
Glucose, Bld: 112 mg/dL — ABNORMAL HIGH (ref 70–99)
Potassium: 3.9 mmol/L (ref 3.5–5.1)
Sodium: 138 mmol/L (ref 135–145)
Total Bilirubin: 0.9 mg/dL (ref 0.3–1.2)
Total Protein: 7.6 g/dL (ref 6.5–8.1)

## 2022-01-16 LAB — TROPONIN I (HIGH SENSITIVITY): Troponin I (High Sensitivity): 5 ng/L (ref <18)

## 2022-01-16 MED ORDER — ACETAMINOPHEN 500 MG PO TABS
1000.0000 mg | ORAL_TABLET | Freq: Once | ORAL | Status: AC
  Administered 2022-01-16: 1000 mg via ORAL
  Filled 2022-01-16: qty 2

## 2022-01-16 MED ORDER — LACTATED RINGERS IV BOLUS
1000.0000 mL | Freq: Once | INTRAVENOUS | Status: AC
  Administered 2022-01-16: 1000 mL via INTRAVENOUS

## 2022-01-16 MED ORDER — OXYCODONE HCL 5 MG PO TABS
10.0000 mg | ORAL_TABLET | Freq: Once | ORAL | Status: DC
  Filled 2022-01-16: qty 2

## 2022-01-16 MED ORDER — KETOROLAC TROMETHAMINE 30 MG/ML IJ SOLN
15.0000 mg | Freq: Once | INTRAMUSCULAR | Status: AC
  Administered 2022-01-16: 15 mg via INTRAVENOUS
  Filled 2022-01-16: qty 1

## 2022-01-16 NOTE — ED Provider Notes (Signed)
[facility] Provider Note    Event Date/Time   First MD Initiated Contact with Patient 01/15/22 2355     (approximate)   History   Generalized Body Aches and Chest Pain   HPI  [name] is a 51 y.o. male who presents to the ED for evaluation of Generalized Body Aches and Chest Pain   I reviewed clinic visit from 11/22, [facility] pain clinic.  Chronic pain syndrome on chronic opiates due to a cervical radiculopathy.  Patient presents to the ED for evaluation of 3 days of cough, fever, myalgias and malaise.  Reports his left neck and shoulder pain are radiating down towards his chest and causing some chest discomfort on the left.  Denies any syncope or falls.  Has had some presyncopal dizziness with standing that he attributes to his poor appetite while being sick for the past few days.   Physical Exam   Triage Vital Signs: ED Triage Vitals  Enc Vitals Group     BP 01/15/22 2301 134/89     Pulse Rate 01/15/22 2301 (!) 103     Resp 01/15/22 2301 20     Temp 01/15/22 2301 99.2 F (37.3 C)     Temp Source 01/15/22 2301 Oral     SpO2 01/15/22 2247 97 %     Weight 01/15/22 2303 220 lb (99.8 kg)     Height 01/15/22 2303 5\' 10"  (1.778 m)     Head Circumference --      Peak Flow --      Pain Score 01/15/22 2328 8     Pain Loc --      Pain Edu? --      Excl. in GC? --     Most recent vital signs: Vitals:   01/16/22 0000 01/16/22 0158  BP: 127/84 (!) 142/91  Pulse: 100 87  Resp: (!) 24 16  Temp:  98.9 F (37.2 C)  SpO2: 99% 95%    General: Awake, no distress.  Appears uncomfortable, but conversational and pleasant. CV:  Good peripheral perfusion.  Resp:  Normal effort.  Abd:  No distention.  Soft MSK:  No deformity noted.  Some reproducible chest discomfort without overlying skin changes or rash. Neuro:  No focal deficits appreciated. Other:     ED Results / Procedures / Treatments   Labs (all labs ordered are listed, but only  abnormal results are displayed) Labs Reviewed  RESP PANEL BY RT-PCR (RSV, FLU A&B, COVID)  RVPGX2 - Abnormal; Notable for the following components:      Result Value   Influenza A by PCR POSITIVE (*)    All other components within normal limits  COMPREHENSIVE METABOLIC PANEL - Abnormal; Notable for the following components:   Glucose, Bld 112 (*)    Creatinine, Ser 1.32 (*)    AST 42 (*)    All other components within normal limits  CBC WITH DIFFERENTIAL/PLATELET  TROPONIN I (HIGH SENSITIVITY)  TROPONIN I (HIGH SENSITIVITY)    EKG Sinus rhythm at a rate of 97 bpm.  Normal axis and intervals.  No clear signs of acute ischemia.  RADIOLOGY CXR interpreted by me without evidence of acute cardiopulmonary pathology.  Official radiology report(s): DG Chest 1 View  Result Date: 01/15/2022 CLINICAL DATA:  Chest pain fevers EXAM: CHEST  1 VIEW COMPARISON:  06/11/2021 FINDINGS: The heart size and mediastinal contours are within normal limits. Both lungs are clear. The visualized skeletal structures are unremarkable. Postsurgical changes are again  seen in the cervical spine. IMPRESSION: No active disease. Electronically Signed   By: Alcide Clever M.D.   On: 01/15/2022 23:58    PROCEDURES and INTERVENTIONS:  .1-3 Lead EKG Interpretation  Performed by: Delton Prairie, MD Authorized by: Delton Prairie, MD     Interpretation: normal     ECG rate:  91   ECG rate assessment: normal     Rhythm: sinus rhythm     Ectopy: none     Conduction: normal     Medications  oxyCODONE (Oxy IR/ROXICODONE) immediate release tablet 10 mg (10 mg Oral Patient Refused/Not Given 01/16/22 0158)  ketorolac (TORADOL) 30 MG/ML injection 15 mg (15 mg Intravenous Given 01/16/22 0032)  acetaminophen (TYLENOL) tablet 1,000 mg (1,000 mg Oral Given 01/16/22 0033)  lactated ringers bolus 1,000 mL (0 mLs Intravenous Stopped 01/16/22 0158)     IMPRESSION / MDM / ASSESSMENT AND PLAN / ED COURSE  I reviewed the triage  vital signs and the nursing notes.  Differential diagnosis includes, but is not limited to, ACS, PTX, PNA, muscle strain/spasm, PE, dissection, viral syndrome  {Patient presents with symptoms of an acute illness or injury that is potentially life-threatening.  51 year old male presents with various symptoms for the past few days, likely due to influenza A.  He initially appears uncomfortable but is nontoxic.  Blood work with CKD around baseline.  Normal CBC without leukocytosis.  Doubt sepsis or bacterial etiology of his symptoms.  Negative troponin but his viral swab does return positive for influenza A.  No signs of pneumonia on CXR.  Will provide some IV fluids, antipyretics and reassess.  Clinical Course as of 01/16/22 0410  Thu Jan 16, 2022  0141 Reassessed.  Patient walking around the room and reports feeling much better.  He is Adult nurse.  No chest pain. [DS]    Clinical Course User Index [DS] Delton Prairie, MD     FINAL CLINICAL IMPRESSION(S) / ED DIAGNOSES   Final diagnoses:  Influenza A     Rx / DC Orders   ED Discharge Orders     None        Note:  This document was prepared using Dragon voice recognition software and may include unintentional dictation errors.   Delton Prairie, MD 01/16/22 [phone]

## 2022-01-16 NOTE — Discharge Instructions (Addendum)
Use Tylenol for pain and fevers.  Up to 1000 mg per dose, up to 4 times per day.  Do not take more than 4000 mg of Tylenol/acetaminophen within 24 hours..
